# Patient Record
Sex: Male | Born: 1965 | Race: White | Hispanic: No | Marital: Married | Smoking: Never smoker
Health system: Southern US, Community
[De-identification: ages and names within clinical notes are randomized; demographics above are authoritative.]

## PROBLEM LIST (undated history)

## (undated) DIAGNOSIS — I10 Essential (primary) hypertension: Secondary | ICD-10-CM

## (undated) DIAGNOSIS — G4730 Sleep apnea, unspecified: Secondary | ICD-10-CM

## (undated) DIAGNOSIS — M1990 Unspecified osteoarthritis, unspecified site: Secondary | ICD-10-CM

## (undated) DIAGNOSIS — S46212A Strain of muscle, fascia and tendon of other parts of biceps, left arm, initial encounter: Secondary | ICD-10-CM

## (undated) DIAGNOSIS — R195 Other fecal abnormalities: Secondary | ICD-10-CM

## (undated) HISTORY — PX: CHOLECYSTECTOMY: SHX55

## (undated) HISTORY — PX: APPENDECTOMY: SHX54

## (undated) HISTORY — DX: Essential (primary) hypertension: I10

## (undated) HISTORY — DX: Other fecal abnormalities: R19.5

---

## 2002-12-20 ENCOUNTER — Emergency Department (HOSPITAL_COMMUNITY): Admission: EM | Admit: 2002-12-20 | Discharge: 2002-12-21 | Payer: Self-pay | Admitting: *Deleted

## 2015-12-20 DIAGNOSIS — M19072 Primary osteoarthritis, left ankle and foot: Secondary | ICD-10-CM | POA: Diagnosis not present

## 2016-03-03 DIAGNOSIS — Z6841 Body Mass Index (BMI) 40.0 and over, adult: Secondary | ICD-10-CM | POA: Diagnosis not present

## 2016-03-03 DIAGNOSIS — Z79899 Other long term (current) drug therapy: Secondary | ICD-10-CM | POA: Diagnosis not present

## 2016-03-03 DIAGNOSIS — K3580 Unspecified acute appendicitis: Secondary | ICD-10-CM | POA: Diagnosis not present

## 2016-03-03 DIAGNOSIS — R1013 Epigastric pain: Secondary | ICD-10-CM | POA: Diagnosis not present

## 2016-03-03 DIAGNOSIS — Z888 Allergy status to other drugs, medicaments and biological substances status: Secondary | ICD-10-CM | POA: Diagnosis not present

## 2016-03-03 DIAGNOSIS — R109 Unspecified abdominal pain: Secondary | ICD-10-CM | POA: Diagnosis not present

## 2016-03-03 DIAGNOSIS — I10 Essential (primary) hypertension: Secondary | ICD-10-CM | POA: Diagnosis not present

## 2016-03-03 DIAGNOSIS — K3589 Other acute appendicitis: Secondary | ICD-10-CM | POA: Diagnosis not present

## 2016-03-03 DIAGNOSIS — K37 Unspecified appendicitis: Secondary | ICD-10-CM | POA: Diagnosis not present

## 2016-03-03 DIAGNOSIS — K353 Acute appendicitis with localized peritonitis: Secondary | ICD-10-CM | POA: Diagnosis not present

## 2016-03-03 DIAGNOSIS — R739 Hyperglycemia, unspecified: Secondary | ICD-10-CM | POA: Diagnosis not present

## 2016-03-03 DIAGNOSIS — E876 Hypokalemia: Secondary | ICD-10-CM | POA: Diagnosis not present

## 2016-03-04 DIAGNOSIS — K353 Acute appendicitis with localized peritonitis: Secondary | ICD-10-CM | POA: Diagnosis not present

## 2016-03-14 DIAGNOSIS — K8020 Calculus of gallbladder without cholecystitis without obstruction: Secondary | ICD-10-CM | POA: Diagnosis not present

## 2016-03-14 DIAGNOSIS — Z6841 Body Mass Index (BMI) 40.0 and over, adult: Secondary | ICD-10-CM | POA: Diagnosis not present

## 2016-03-14 DIAGNOSIS — I10 Essential (primary) hypertension: Secondary | ICD-10-CM | POA: Diagnosis not present

## 2016-03-14 DIAGNOSIS — E876 Hypokalemia: Secondary | ICD-10-CM | POA: Diagnosis not present

## 2016-05-19 DIAGNOSIS — Z79899 Other long term (current) drug therapy: Secondary | ICD-10-CM | POA: Diagnosis not present

## 2016-05-19 DIAGNOSIS — K8050 Calculus of bile duct without cholangitis or cholecystitis without obstruction: Secondary | ICD-10-CM | POA: Diagnosis not present

## 2016-05-19 DIAGNOSIS — K8070 Calculus of gallbladder and bile duct without cholecystitis without obstruction: Secondary | ICD-10-CM | POA: Diagnosis not present

## 2016-05-19 DIAGNOSIS — R112 Nausea with vomiting, unspecified: Secondary | ICD-10-CM | POA: Diagnosis not present

## 2016-05-19 DIAGNOSIS — K8020 Calculus of gallbladder without cholecystitis without obstruction: Secondary | ICD-10-CM | POA: Diagnosis not present

## 2016-05-19 DIAGNOSIS — R1011 Right upper quadrant pain: Secondary | ICD-10-CM | POA: Diagnosis not present

## 2016-05-20 DIAGNOSIS — Z6841 Body Mass Index (BMI) 40.0 and over, adult: Secondary | ICD-10-CM | POA: Diagnosis not present

## 2016-05-20 DIAGNOSIS — K8020 Calculus of gallbladder without cholecystitis without obstruction: Secondary | ICD-10-CM | POA: Diagnosis not present

## 2016-05-22 DIAGNOSIS — K8012 Calculus of gallbladder with acute and chronic cholecystitis without obstruction: Secondary | ICD-10-CM | POA: Diagnosis not present

## 2016-05-23 DIAGNOSIS — K8012 Calculus of gallbladder with acute and chronic cholecystitis without obstruction: Secondary | ICD-10-CM | POA: Diagnosis not present

## 2016-05-23 DIAGNOSIS — I10 Essential (primary) hypertension: Secondary | ICD-10-CM | POA: Diagnosis not present

## 2016-05-23 DIAGNOSIS — Z8249 Family history of ischemic heart disease and other diseases of the circulatory system: Secondary | ICD-10-CM | POA: Diagnosis not present

## 2016-05-23 DIAGNOSIS — Z79899 Other long term (current) drug therapy: Secondary | ICD-10-CM | POA: Diagnosis not present

## 2016-05-23 DIAGNOSIS — Z6841 Body Mass Index (BMI) 40.0 and over, adult: Secondary | ICD-10-CM | POA: Diagnosis not present

## 2016-05-26 DIAGNOSIS — K8012 Calculus of gallbladder with acute and chronic cholecystitis without obstruction: Secondary | ICD-10-CM | POA: Diagnosis not present

## 2016-05-26 DIAGNOSIS — Z6841 Body Mass Index (BMI) 40.0 and over, adult: Secondary | ICD-10-CM | POA: Diagnosis not present

## 2016-05-26 DIAGNOSIS — K8050 Calculus of bile duct without cholangitis or cholecystitis without obstruction: Secondary | ICD-10-CM | POA: Diagnosis not present

## 2016-05-26 DIAGNOSIS — Z79899 Other long term (current) drug therapy: Secondary | ICD-10-CM | POA: Diagnosis not present

## 2016-05-26 DIAGNOSIS — M069 Rheumatoid arthritis, unspecified: Secondary | ICD-10-CM | POA: Diagnosis not present

## 2016-05-26 DIAGNOSIS — I10 Essential (primary) hypertension: Secondary | ICD-10-CM | POA: Diagnosis not present

## 2016-05-26 DIAGNOSIS — K8010 Calculus of gallbladder with chronic cholecystitis without obstruction: Secondary | ICD-10-CM | POA: Diagnosis not present

## 2016-05-26 DIAGNOSIS — K8000 Calculus of gallbladder with acute cholecystitis without obstruction: Secondary | ICD-10-CM | POA: Diagnosis not present

## 2016-05-26 DIAGNOSIS — Z8249 Family history of ischemic heart disease and other diseases of the circulatory system: Secondary | ICD-10-CM | POA: Diagnosis not present

## 2016-05-27 DIAGNOSIS — Z6841 Body Mass Index (BMI) 40.0 and over, adult: Secondary | ICD-10-CM | POA: Diagnosis not present

## 2016-05-27 DIAGNOSIS — Z79899 Other long term (current) drug therapy: Secondary | ICD-10-CM | POA: Diagnosis not present

## 2016-05-27 DIAGNOSIS — Z8249 Family history of ischemic heart disease and other diseases of the circulatory system: Secondary | ICD-10-CM | POA: Diagnosis not present

## 2016-05-27 DIAGNOSIS — K8012 Calculus of gallbladder with acute and chronic cholecystitis without obstruction: Secondary | ICD-10-CM | POA: Diagnosis not present

## 2016-05-27 DIAGNOSIS — I10 Essential (primary) hypertension: Secondary | ICD-10-CM | POA: Diagnosis not present

## 2016-11-13 DIAGNOSIS — I10 Essential (primary) hypertension: Secondary | ICD-10-CM | POA: Diagnosis not present

## 2016-11-13 DIAGNOSIS — E782 Mixed hyperlipidemia: Secondary | ICD-10-CM | POA: Diagnosis not present

## 2016-11-13 DIAGNOSIS — E876 Hypokalemia: Secondary | ICD-10-CM | POA: Diagnosis not present

## 2016-11-20 DIAGNOSIS — Z0001 Encounter for general adult medical examination with abnormal findings: Secondary | ICD-10-CM | POA: Diagnosis not present

## 2016-11-20 DIAGNOSIS — Z6841 Body Mass Index (BMI) 40.0 and over, adult: Secondary | ICD-10-CM | POA: Diagnosis not present

## 2017-05-18 DIAGNOSIS — R7301 Impaired fasting glucose: Secondary | ICD-10-CM | POA: Diagnosis not present

## 2017-05-18 DIAGNOSIS — E782 Mixed hyperlipidemia: Secondary | ICD-10-CM | POA: Diagnosis not present

## 2017-05-18 DIAGNOSIS — E876 Hypokalemia: Secondary | ICD-10-CM | POA: Diagnosis not present

## 2017-05-18 DIAGNOSIS — E291 Testicular hypofunction: Secondary | ICD-10-CM | POA: Diagnosis not present

## 2017-05-18 DIAGNOSIS — I10 Essential (primary) hypertension: Secondary | ICD-10-CM | POA: Diagnosis not present

## 2017-05-21 DIAGNOSIS — I10 Essential (primary) hypertension: Secondary | ICD-10-CM | POA: Diagnosis not present

## 2017-05-21 DIAGNOSIS — E876 Hypokalemia: Secondary | ICD-10-CM | POA: Diagnosis not present

## 2017-05-21 DIAGNOSIS — K8020 Calculus of gallbladder without cholecystitis without obstruction: Secondary | ICD-10-CM | POA: Diagnosis not present

## 2017-07-23 DIAGNOSIS — Z1211 Encounter for screening for malignant neoplasm of colon: Secondary | ICD-10-CM | POA: Diagnosis not present

## 2017-07-23 DIAGNOSIS — Z1212 Encounter for screening for malignant neoplasm of rectum: Secondary | ICD-10-CM | POA: Diagnosis not present

## 2017-08-05 LAB — IFOBT (OCCULT BLOOD): IFOBT: POSITIVE

## 2017-08-11 DIAGNOSIS — J09X9 Influenza due to identified novel influenza A virus with other manifestations: Secondary | ICD-10-CM | POA: Diagnosis not present

## 2017-08-11 DIAGNOSIS — Z6841 Body Mass Index (BMI) 40.0 and over, adult: Secondary | ICD-10-CM | POA: Diagnosis not present

## 2017-09-10 ENCOUNTER — Encounter (INDEPENDENT_AMBULATORY_CARE_PROVIDER_SITE_OTHER): Payer: Self-pay | Admitting: Internal Medicine

## 2017-09-10 ENCOUNTER — Telehealth (INDEPENDENT_AMBULATORY_CARE_PROVIDER_SITE_OTHER): Payer: Self-pay | Admitting: *Deleted

## 2017-09-10 ENCOUNTER — Encounter (INDEPENDENT_AMBULATORY_CARE_PROVIDER_SITE_OTHER): Payer: Self-pay | Admitting: *Deleted

## 2017-09-10 ENCOUNTER — Ambulatory Visit (INDEPENDENT_AMBULATORY_CARE_PROVIDER_SITE_OTHER): Payer: BLUE CROSS/BLUE SHIELD | Admitting: Internal Medicine

## 2017-09-10 VITALS — BP 158/98 | HR 72 | Temp 98.7°F | Ht 71.5 in | Wt 305.2 lb

## 2017-09-10 DIAGNOSIS — R195 Other fecal abnormalities: Secondary | ICD-10-CM

## 2017-09-10 DIAGNOSIS — I10 Essential (primary) hypertension: Secondary | ICD-10-CM | POA: Insufficient documentation

## 2017-09-10 HISTORY — DX: Other fecal abnormalities: R19.5

## 2017-09-10 LAB — CBC
HCT: 43.2 % (ref 38.5–50.0)
HEMOGLOBIN: 15.1 g/dL (ref 13.2–17.1)
MCH: 28.7 pg (ref 27.0–33.0)
MCHC: 35 g/dL (ref 32.0–36.0)
MCV: 82 fL (ref 80.0–100.0)
MPV: 9.6 fL (ref 7.5–12.5)
Platelets: 343 10*3/uL (ref 140–400)
RBC: 5.27 10*6/uL (ref 4.20–5.80)
RDW: 14.3 % (ref 11.0–15.0)
WBC: 9.2 10*3/uL (ref 3.8–10.8)

## 2017-09-10 MED ORDER — PEG 3350-KCL-NA BICARB-NACL 420 G PO SOLR: 4000.0000 mL | Freq: Once | ORAL | 0 refills | Status: AC

## 2017-09-10 NOTE — Progress Notes (Signed)
   Subjective:    Patient ID: Carlos Rogers, male    DOB: 1966/03/19, 52 y.o.   MRN: [id number]  HPI Referred by Dr. Quillian Quince for positive hemosure. He states he has not seen any blood in his stools. Since he has had GB, he usually has a BM after each meal. Stools are loose about 50% of the time. His appetite is okay. No weight loss that he know of.  Patient has never undergone a colonoscopy.  Takes Advil 4 tabs once or twice a day for pain his left foot.   No family hx of colon cancer per wife.     05/19/2017 total bili 0.6, ALP 79, AST 23, ALT 20./   Review of Systems     Past Medical History:  Diagnosis Date  . Hypertension   . Occult blood in stools       No Known Allergies  Current Outpatient Medications on File Prior to Visit  Medication Sig Dispense Refill  . amLODipine (NORVASC) 10 MG tablet Take 10 mg by mouth daily.    . chlorthalidone (HYGROTON) 25 MG tablet Take 25 mg by mouth daily.    . sildenafil (REVATIO) 20 MG tablet Take 20 mg by mouth as needed.     No current facility-administered medications on file prior to visit.      Objective:   Physical Exam Blood pressure (!) 158/98, pulse 72, temperature 98.7 F (37.1 C), height 5' 11.5" (1.816 m), weight (!) 305 lb 3.2 oz (138.4 kg). Alert and oriented. Skin warm and dry. Oral mucosa is moist.   . Sclera anicteric, conjunctivae is pink. Thyroid not enlarged. No cervical lymphadenopathy. Lungs clear. Heart regular rate and rhythm.  Abdomen is soft. Bowel sounds are positive. No hepatomegaly. No abdominal masses felt. No tenderness.  No edema to lower extremities.         Assessment & Plan:  Positive hemosure. Needs colonoscopy to rule out colonic neoplasm, AVM, polyp, ulcer, hemorrhoids. CBC today

## 2017-09-10 NOTE — Telephone Encounter (Signed)
Patient needs trilyte 

## 2017-09-10 NOTE — Patient Instructions (Signed)
The risks of bleeding, perforation and infection were reviewed with patient.  

## 2017-09-14 DIAGNOSIS — M4696 Unspecified inflammatory spondylopathy, lumbar region: Secondary | ICD-10-CM | POA: Diagnosis not present

## 2017-09-14 DIAGNOSIS — E876 Hypokalemia: Secondary | ICD-10-CM | POA: Diagnosis not present

## 2017-09-14 DIAGNOSIS — M48061 Spinal stenosis, lumbar region without neurogenic claudication: Secondary | ICD-10-CM | POA: Diagnosis not present

## 2017-09-14 DIAGNOSIS — I10 Essential (primary) hypertension: Secondary | ICD-10-CM | POA: Diagnosis not present

## 2017-09-14 DIAGNOSIS — Z79899 Other long term (current) drug therapy: Secondary | ICD-10-CM | POA: Diagnosis not present

## 2017-09-14 DIAGNOSIS — M545 Low back pain: Secondary | ICD-10-CM | POA: Diagnosis not present

## 2017-09-14 DIAGNOSIS — M47816 Spondylosis without myelopathy or radiculopathy, lumbar region: Secondary | ICD-10-CM | POA: Diagnosis not present

## 2017-10-08 ENCOUNTER — Other Ambulatory Visit: Payer: Self-pay

## 2017-10-08 ENCOUNTER — Ambulatory Visit (HOSPITAL_COMMUNITY)
Admission: RE | Admit: 2017-10-08 | Discharge: 2017-10-08 | Disposition: A | Discharge status: Home / Self Care | Payer: BLUE CROSS/BLUE SHIELD | Source: Ambulatory Visit | Attending: Internal Medicine | Admitting: Internal Medicine

## 2017-10-08 ENCOUNTER — Encounter (HOSPITAL_COMMUNITY): Payer: Self-pay | Admitting: *Deleted

## 2017-10-08 ENCOUNTER — Encounter (HOSPITAL_COMMUNITY)
Admission: RE | Disposition: A | Discharge status: Home / Self Care | Payer: Self-pay | Source: Ambulatory Visit | Attending: Internal Medicine

## 2017-10-08 DIAGNOSIS — D123 Benign neoplasm of transverse colon: Secondary | ICD-10-CM | POA: Insufficient documentation

## 2017-10-08 DIAGNOSIS — R195 Other fecal abnormalities: Secondary | ICD-10-CM | POA: Diagnosis not present

## 2017-10-08 DIAGNOSIS — I10 Essential (primary) hypertension: Secondary | ICD-10-CM | POA: Diagnosis not present

## 2017-10-08 DIAGNOSIS — K648 Other hemorrhoids: Secondary | ICD-10-CM | POA: Diagnosis not present

## 2017-10-08 DIAGNOSIS — K5730 Diverticulosis of large intestine without perforation or abscess without bleeding: Secondary | ICD-10-CM | POA: Diagnosis not present

## 2017-10-08 DIAGNOSIS — K635 Polyp of colon: Secondary | ICD-10-CM | POA: Insufficient documentation

## 2017-10-08 DIAGNOSIS — K644 Residual hemorrhoidal skin tags: Secondary | ICD-10-CM | POA: Insufficient documentation

## 2017-10-08 DIAGNOSIS — E669 Obesity, unspecified: Secondary | ICD-10-CM | POA: Diagnosis not present

## 2017-10-08 HISTORY — PX: COLONOSCOPY: SHX5424

## 2017-10-08 SURGERY — COLONOSCOPY
Anesthesia: Moderate Sedation

## 2017-10-08 MED ORDER — MEPERIDINE HCL 50 MG/ML IJ SOLN
INTRAMUSCULAR | Status: AC
  Filled 2017-10-08: qty 1

## 2017-10-08 MED ORDER — MIDAZOLAM HCL 5 MG/5ML IJ SOLN
INTRAMUSCULAR | Status: AC
  Filled 2017-10-08: qty 10

## 2017-10-08 MED ORDER — MEPERIDINE HCL 50 MG/ML IJ SOLN
INTRAMUSCULAR | Status: DC | PRN
  Administered 2017-10-08 (×2): 25 mg via INTRAVENOUS

## 2017-10-08 MED ORDER — SIMETHICONE 40 MG/0.6ML PO SUSP
ORAL | Status: DC | PRN
  Administered 2017-10-08: 10:00:00

## 2017-10-08 MED ORDER — SODIUM CHLORIDE 0.9 % IV SOLN
INTRAVENOUS | Status: DC
  Administered 2017-10-08: 10:00:00 via INTRAVENOUS

## 2017-10-08 MED ORDER — MIDAZOLAM HCL 5 MG/5ML IJ SOLN
INTRAMUSCULAR | Status: DC | PRN
  Administered 2017-10-08: 2 mg via INTRAVENOUS
  Administered 2017-10-08: 1 mg via INTRAVENOUS
  Administered 2017-10-08: 3 mg via INTRAVENOUS
  Administered 2017-10-08: 1 mg via INTRAVENOUS
  Administered 2017-10-08: 2 mg via INTRAVENOUS
  Administered 2017-10-08: 1 mg via INTRAVENOUS

## 2017-10-08 NOTE — Op Note (Addendum)
[facility] Patient Name: Carlos Rogers Procedure Date: 10/08/2017 10:01 AM MRN: [id number] Date of Birth: March 12, 1966 Attending MD: Hildred Laser , MD CSN: [id number] Age: 52 Admit Type: Outpatient Procedure:                Colonoscopy Indications:              Heme positive stool Providers:                Hildred Laser, MD, Otis Peak B. Sharon Seller, RN, Nelma Rothman, Technician Referring MD:             Gar Ponto, MD Medicines:                Meperidine 50 mg IV, Midazolam 10 mg IV Complications:            No immediate complications. Estimated Blood Loss:     Estimated blood loss: none. Procedure:                Pre-Anesthesia Assessment:                           - Prior to the procedure, a History and Physical                            was performed, and patient medications and                            allergies were reviewed. The patient's tolerance of                            previous anesthesia was also reviewed. The risks                            and benefits of the procedure and the sedation                            options and risks were discussed with the patient.                            All questions were answered, and informed consent                            was obtained. Prior Anticoagulants: The patient                            last took ibuprofen 5 days prior to the procedure.                            ASA Grade Assessment: II - A patient with mild                            systemic disease. After reviewing the risks and  benefits, the patient was deemed in satisfactory                            condition to undergo the procedure.                           After obtaining informed consent, the colonoscope                            was passed under direct vision. Throughout the                            procedure, the patient's blood pressure, pulse, and                            oxygen  saturations were monitored continuously. The                            EC-[id number] ([id number]) scope was introduced through                            the anus and advanced to the the cecum, identified                            by appendiceal orifice and ileocecal valve. The                            colonoscopy was performed without difficulty. The                            patient tolerated the procedure well. The quality                            of the bowel preparation was excellent. Scope In: 10:21:11 AM Scope Out: 10:53:06 AM Scope Withdrawal Time: 0 hours 28 minutes 25 seconds  Total Procedure Duration: 0 hours 31 minutes 55 seconds  Findings:      The perianal and digital rectal examinations were normal.      A 6 mm polyp was found in the mid transverse colon. The polyp was       semi-sessile. The polyp was removed with a hot snare. Resection and       retrieval were complete. The pathology specimen was placed into Bottle       Number 1.      A 6 mm polyp was found in the splenic flexure. The polyp was       semi-sessile. The polyp was removed with a hot snare. Resection was       complete, but the polyp tissue was not retrieved.      The exam was otherwise normal throughout the examined colon.      External and internal hemorrhoids were found during retroflexion.      A single small-mouthed diverticulum was found in the cecum. Impression:               - One 6 mm polyp in the mid transverse colon,  removed with a hot snare. Resected and retrieved.                           - One 6 mm polyp at the splenic flexure, removed                            with a hot snare. Complete resection. Polyp tissue                            not retrieved.                           - Single small cecal diverticulum.                           - External and internal hemorrhoids. Moderate Sedation:      Moderate (conscious) sedation was administered by the endoscopy  nurse       and supervised by the endoscopist. The following parameters were       monitored: oxygen saturation, heart rate, blood pressure, CO2       capnography and response to care. Total physician intraservice time was       39 minutes. Recommendation:           - Patient has a contact number available for                            emergencies. The signs and symptoms of potential                            delayed complications were discussed with the                            patient. Return to normal activities tomorrow.                            Written discharge instructions were provided to the                            patient.                           - Resume previous diet today.                           - Continue present medications.                           - No aspirin, ibuprofen, naproxen, or other                            non-steroidal anti-inflammatory drugs for 5 days                            after polyp removal.                           -  Await pathology results.                           - Repeat colonoscopy in 5 years for surveillance. Procedure Code(s):        --- Professional ---                           [phone], Colonoscopy, flexible; with removal of                            tumor(s), polyp(s), or other lesion(s) by snare                            technique                           G0500, Moderate sedation services provided by the                            same physician or other qualified health care                            professional performing a gastrointestinal                            endoscopic service that sedation supports,                            requiring the presence of an independent trained                            observer to assist in the monitoring of the                            patient's level of consciousness and physiological                            status; initial 15 minutes of intra-service time;                             patient age 27 years or older (additional time may                            be reported with [phone], as appropriate)                           [phone], Moderate sedation services provided by the                            same physician or other qualified health care                            professional performing the diagnostic or  therapeutic service that the sedation supports,                            requiring the presence of an independent trained                            observer to assist in the monitoring of the                            patient's level of consciousness and physiological                            status; each additional 15 minutes intraservice                            time (List separately in addition to code for                            primary service)                           [phone], Moderate sedation services provided by the                            same physician or other qualified health care                            professional performing the diagnostic or                            therapeutic service that the sedation supports,                            requiring the presence of an independent trained                            observer to assist in the monitoring of the                            patient's level of consciousness and physiological                            status; each additional 15 minutes intraservice                            time (List separately in addition to code for                            primary service) Diagnosis Code(s):        --- Professional ---                           D12.3, Benign neoplasm of transverse colon (hepatic  flexure or splenic flexure)                           K64.8, Other hemorrhoids                           R19.5, Other fecal abnormalities CPT copyright 2017 American Medical Association. All rights reserved. The  codes documented in this report are preliminary and upon coder review may  be revised to meet current compliance requirements. Hildred Laser, MD Hildred Laser, MD 10/08/2017 11:03:23 AM This report has been signed electronically. Number of Addenda: 0

## 2017-10-08 NOTE — H&P (Signed)
Carlos Rogers is an 52 y.o. male.   Chief Complaint: Patient is here for colonoscopy. HPI: Patient is a 52 year old Caucasian male who was found to have heme positive stool on routine testing(hemasure) and is therefore here for diagnostic colonoscopy.  He denies abdominal pain change in bowel habits melena or rectal bleeding.  He takes Advil on as-needed basis.  He may take Advil once or twice a week.  Usual doses 800 mg a day. Family history is negative for CRC.  Past Medical History:  Diagnosis Date  . Obesity 09/10/2017  . Hypertension         Past Surgical History:  Procedure Laterality Date  . APPENDECTOMY    . CHOLECYSTECTOMY      Family History  Problem Relation Age of Onset  . Heart disease Mother   . Hypertension Mother    Social History:  reports that he has never smoked. He has never used smokeless tobacco. He reports that he drank alcohol. He reports that he does not use drugs.  Allergies: No Known Allergies  Medications Prior to Admission  Medication Sig Dispense Refill  . amLODipine (NORVASC) 10 MG tablet Take 10 mg by mouth daily.    . chlorthalidone (HYGROTON) 25 MG tablet Take 25 mg by mouth daily.    Marland Kitchen ibuprofen (ADVIL,MOTRIN) 200 MG tablet Take 600-800 mg by mouth every 8 (eight) hours as needed (for pain.).    Marland Kitchen sildenafil (REVATIO) 20 MG tablet Take 20 mg by mouth daily as needed (for erectile dysfunction).       No results found for this or any previous visit (from the past 48 hour(s)). No results found.  ROS  Blood pressure 128/78, pulse 72, temperature 98.9 F (37.2 C), temperature source Oral, resp. rate 15, SpO2 96 %. Physical Exam  Constitutional:  Well-developed obese Caucasian male in NAD.  HENT:  Mouth/Throat: Oropharynx is clear and moist.  Eyes: Conjunctivae are normal. No scleral icterus.  Neck: No thyromegaly present.  Cardiovascular: Normal rate, regular rhythm and normal heart sounds.  No murmur heard. Respiratory: Effort normal  and breath sounds normal.  GI:  Abdomen is full.  Supraumbilical scar noted along with laparoscopy scars in upper abdomen.  Abdomen is soft and nontender with organomegaly or masses.  Small subcutaneous lipoma noted along the left lateral wall below the costal margin.  Musculoskeletal: He exhibits no edema.  Lymphadenopathy:    He has no cervical adenopathy.  Neurological: He is alert.  Skin: Skin is warm and dry.     Assessment/Plan Heme positive stool. Diagnostic colonoscopy.  Hildred Laser, MD 10/08/2017, 10:09 AM

## 2017-10-08 NOTE — Discharge Instructions (Signed)
Colonoscopy, Adult, Care After This sheet gives you information about how to care for yourself after your procedure. Your doctor may also give you more specific instructions. If you have problems or questions, call your doctor. Follow these instructions at home: General instructions   For the first 24 hours after the procedure: ? Do not drive or use machinery. ? Do not sign important documents. ? Do not drink alcohol. ? Do your daily activities more slowly than normal. ? Eat foods that are soft and easy to digest. ? Rest often.  Take over-the-counter or prescription medicines only as told by your doctor.  It is up to you to get the results of your procedure. Ask your doctor, or the department performing the procedure, when your results will be ready. To help cramping and bloating:  Try walking around.  Put heat on your belly (abdomen) as told by your doctor. Use a heat source that your doctor recommends, such as a moist heat pack or a heating pad. ? Put a towel between your skin and the heat source. ? Leave the heat on for 20-30 minutes. ? Remove the heat if your skin turns bright red. This is especially important if you cannot feel pain, heat, or cold. You can get burned. Eating and drinking  Drink enough fluid to keep your pee (urine) clear or pale yellow.  Return to your normal diet as told by your doctor. Avoid heavy or fried foods that are hard to digest.  Avoid drinking alcohol for as long as told by your doctor. Contact a doctor if:  You have blood in your poop (stool) 2-3 days after the procedure. Get help right away if:  You have more than a small amount of blood in your poop.  You see large clumps of tissue (blood clots) in your poop.  Your belly is swollen.  You feel sick to your stomach (nauseous).  You throw up (vomit).  You have a fever.  You have belly pain that gets worse, and medicine does not help your pain. This information is not intended to  replace advice given to you by your health care provider. Make sure you discuss any questions you have with your health care provider.   No aspirin or NSAIDs for 5 days. Resume other medications and diet as before. No driving for 24 hours. Physician will call with biopsy results.   Colon Polyps Polyps are tissue growths inside the body. Polyps can grow in many places, including the large intestine (colon). A polyp may be a round bump or a mushroom-shaped growth. You could have one polyp or several. Most colon polyps are noncancerous (benign). However, some colon polyps can become cancerous over time. What are the causes? The exact cause of colon polyps is not known. What increases the risk? This condition is more likely to develop in people who:  Have a family history of colon cancer or colon polyps.  Are older than 87 or older than 45 if they are African American.  Have inflammatory bowel disease, such as ulcerative colitis or Crohn disease.  Are overweight.  Smoke cigarettes.  Do not get enough exercise.  Drink too much alcohol.  Eat a diet that is: ? High in fat and red meat. ? Low in fiber.  Had childhood cancer that was treated with abdominal radiation.  What are the signs or symptoms? Most polyps do not cause symptoms. If you have symptoms, they may include:  Blood coming from your rectum when having a  bowel movement.  Blood in your stool.The stool may look dark red or black.  A change in bowel habits, such as constipation or diarrhea.  How is this diagnosed? This condition is diagnosed with a colonoscopy. This is a procedure that uses a lighted, flexible scope to look at the inside of your colon. How is this treated? Treatment for this condition involves removing any polyps that are found. Those polyps will then be tested for cancer. If cancer is found, your health care provider will talk to you about options for colon cancer treatment. Follow these  instructions at home: Diet  Eat plenty of fiber, such as fruits, vegetables, and whole grains.  Eat foods that are high in calcium and vitamin D, such as milk, cheese, yogurt, eggs, liver, fish, and broccoli.  Limit foods high in fat, red meats, and processed meats, such as hot dogs, sausage, bacon, and lunch meats.  Maintain a healthy weight, or lose weight if recommended by your health care provider. General instructions  Do not smoke cigarettes.  Do not drink alcohol excessively.  Keep all follow-up visits as told by your health care provider. This is important. This includes keeping regularly scheduled colonoscopies. Talk to your health care provider about when you need a colonoscopy.  Exercise every day or as told by your health care provider. Contact a health care provider if:  You have new or worsening bleeding during a bowel movement.  You have new or increased blood in your stool.  You have a change in bowel habits.  You unexpectedly lose weight. This information is not intended to replace advice given to you by your health care provider. Make sure you discuss any questions you have with your health care provider. Document Released: 03/05/2004 Document Revised: 11/15/2015 Document Reviewed: 04/30/2015 Elsevier Interactive Patient Education  2018 [employer].    Hemorrhoids Hemorrhoids are swollen veins in and around the rectum or anus. Hemorrhoids can cause pain, itching, or bleeding. Most of the time, they do not cause serious problems. They usually get better with diet changes, lifestyle changes, and other home treatments. Follow these instructions at home: Eating and drinking  Eat foods that have fiber, such as whole grains, beans, nuts, fruits, and vegetables. Ask your doctor about taking products that have added fiber (fibersupplements).  Drink enough fluid to keep your pee (urine) clear or pale yellow. For Pain and Swelling  Take a warm-water bath (sitz  bath) for 20 minutes to ease pain. Do this 3-4 times a day.  If directed, put ice on the painful area. It may be helpful to use ice between your warm baths. ? Put ice in a plastic bag. ? Place a towel between your skin and the bag. ? Leave the ice on for 20 minutes, 2-3 times a day. General instructions  Take over-the-counter and prescription medicines only as told by your doctor. ? Medicated creams and medicines that are inserted into the anus (suppositories) may be used or applied as told.  Exercise often.  Go to the bathroom when you have the urge to poop (to have a bowel movement). Do not wait.  Avoid pushing too hard (straining) when you poop.  Keep the butt area dry and clean. Use wet toilet paper or moist paper towels.  Do not sit on the toilet for a long time. Contact a doctor if:  You have any of these: ? Pain and swelling that do not get better with treatment or medicine. ? Bleeding that will not  stop. ? Trouble pooping or you cannot poop. ? Pain or swelling outside the area of the hemorrhoids. This information is not intended to replace advice given to you by your health care provider. Make sure you discuss any questions you have with your health care provider.    Diverticulosis Diverticulosis is a condition that develops when small pouches (diverticula) form in the wall of the large intestine (colon). The colon is where water is absorbed and stool is formed. The pouches form when the inside layer of the colon pushes through weak spots in the outer layers of the colon. You may have a few pouches or many of them. What are the causes? The cause of this condition is not known. What increases the risk? The following factors may make you more likely to develop this condition:  Being older than age 38. Your risk for this condition increases with age. Diverticulosis is rare among people younger than age 62. By age 22, many people have it.  Eating a low-fiber  diet.  Having frequent constipation.  Being overweight.  Not getting enough exercise.  Smoking.  Taking over-the-counter pain medicines, like aspirin and ibuprofen.  Having a family history of diverticulosis.  What are the signs or symptoms? In most people, there are no symptoms of this condition. If you do have symptoms, they may include:  Bloating.  Cramps in the abdomen.  Constipation or diarrhea.  Pain in the lower left side of the abdomen.  How is this diagnosed? This condition is most often diagnosed during an exam for other colon problems. Because diverticulosis usually has no symptoms, it often cannot be diagnosed independently. This condition may be diagnosed by:  Using a flexible scope to examine the colon (colonoscopy).  Taking an X-ray of the colon after dye has been put into the colon (barium enema).  Doing a CT scan.  How is this treated? You may not need treatment for this condition if you have never developed an infection related to diverticulosis. If you have had an infection before, treatment may include:  Eating a high-fiber diet. This may include eating more fruits, vegetables, and grains.  Taking a fiber supplement.  Taking a live bacteria supplement (probiotic).  Taking medicine to relax your colon.  Taking antibiotic medicines.  Follow these instructions at home:  Drink 6-8 glasses of water or more each day to prevent constipation.  Try not to strain when you have a bowel movement.  If you have had an infection before: ? Eat more fiber as directed by your health care provider or your diet and nutrition specialist (dietitian). ? Take a fiber supplement or probiotic, if your health care provider approves.  Take over-the-counter and prescription medicines only as told by your health care provider.  If you were prescribed an antibiotic, take it as told by your health care provider. Do not stop taking the antibiotic even if you start to  feel better.  Keep all follow-up visits as told by your health care provider. This is important. Contact a health care provider if:  You have pain in your abdomen.  You have bloating.  You have cramps.  You have not had a bowel movement in 3 days. Get help right away if:  Your pain gets worse.  Your bloating becomes very bad.  You have a fever or chills, and your symptoms suddenly get worse.  You vomit.  You have bowel movements that are bloody or black.  You have bleeding from your rectum. Summary  Diverticulosis is a condition that develops when small pouches (diverticula) form in the wall of the large intestine (colon).  You may have a few pouches or many of them.  This condition is most often diagnosed during an exam for other colon problems.  If you have had an infection related to diverticulosis, treatment may include increasing the fiber in your diet, taking supplements, or taking medicines. This information is not intended to replace advice given to you by your health care provider. Make sure you discuss any questions you have with your health care provider. Marland Kitchen

## 2017-10-14 ENCOUNTER — Encounter (HOSPITAL_COMMUNITY): Payer: Self-pay | Admitting: Internal Medicine

## 2017-12-18 DIAGNOSIS — I10 Essential (primary) hypertension: Secondary | ICD-10-CM | POA: Diagnosis not present

## 2017-12-18 DIAGNOSIS — E782 Mixed hyperlipidemia: Secondary | ICD-10-CM | POA: Diagnosis not present

## 2017-12-18 DIAGNOSIS — R7301 Impaired fasting glucose: Secondary | ICD-10-CM | POA: Diagnosis not present

## 2017-12-18 DIAGNOSIS — E876 Hypokalemia: Secondary | ICD-10-CM | POA: Diagnosis not present

## 2017-12-21 DIAGNOSIS — Z0001 Encounter for general adult medical examination with abnormal findings: Secondary | ICD-10-CM | POA: Diagnosis not present

## 2017-12-21 DIAGNOSIS — E876 Hypokalemia: Secondary | ICD-10-CM | POA: Diagnosis not present

## 2017-12-21 DIAGNOSIS — I10 Essential (primary) hypertension: Secondary | ICD-10-CM | POA: Diagnosis not present

## 2018-02-04 ENCOUNTER — Ambulatory Visit (INDEPENDENT_AMBULATORY_CARE_PROVIDER_SITE_OTHER): Payer: Self-pay

## 2018-02-04 ENCOUNTER — Ambulatory Visit (INDEPENDENT_AMBULATORY_CARE_PROVIDER_SITE_OTHER): Payer: BLUE CROSS/BLUE SHIELD | Admitting: Orthopaedic Surgery

## 2018-02-04 ENCOUNTER — Encounter (INDEPENDENT_AMBULATORY_CARE_PROVIDER_SITE_OTHER): Payer: Self-pay | Admitting: Orthopaedic Surgery

## 2018-02-04 VITALS — BP 121/73 | HR 66 | Ht 71.0 in | Wt 310.0 lb

## 2018-02-04 DIAGNOSIS — M25572 Pain in left ankle and joints of left foot: Secondary | ICD-10-CM | POA: Diagnosis not present

## 2018-02-04 NOTE — Progress Notes (Signed)
Office Visit Note   Patient: Carlos Rogers           Date of Birth: 1966-04-06           MRN: [id number] Visit Date: 02/04/2018              Requested by: Caryl Bis, MD [place], [place] [address] PCP: Caryl Bis, MD   Assessment & Plan: Visit Diagnoses:  1. 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  Surgical option would be ankle arthroscopy with debridement of anterior impinging spurs.  We discussed potential for regrowth of the spurs.  Due to stiffness in his ankle he no longer is suffering from recurrent spurs.  He has had anti-inflammatories intra-articular injections and chronic symptoms for several years worse in the last 8 months.  Plan would be outpatient anterior spur excision with Intra-Op C arm to make sure he had adequate spur resection.  Removal of loose bodies which are catching.  Procedure discussed questions were elicited and answered patient understands and requests we proceed.  Follow-Up Instructions: No follow-ups on file.   Orders:  Orders Placed This Encounter  Procedures  . XR Ankle Complete Left   No orders of the defined types were placed in this encounter.     Procedures: No procedures performed   Clinical Data: No additional findings.   Subjective: Chief Complaint  Patient presents with  . Left Ankle - Pain    HPI 52 year old male seen with recurrent left ankle pain.  He was seen in June 2017 with problems with his left ankle with history of ankle sprains greater than 12 with degenerative spurring.  He does okay riding a bicycle bone he walks he limps due to inability to dorsiflex past neutral.  He points directly over the anterior aspect of the ankle where he has pain.  He is used Advil is had injections in the past uses intermittent ice without relief.  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  No recent ankle sprains in the last year.  Original injury was playing basketball when he was a teenager.  X-rays demonstrate enlargement of spurs compared to 2017 with anterior touching impinging spurs.  Patient states that his pain has progressed it bothers him at night when he tries to sleep and when he ambulates in the community with his family.  He states he is ready for surgery.  Review of Systems positive for hypertension on chlorthalidone and Norvasc.  He is used ibuprofen and Aleve for his ankle.  History of guaiac positive stools.  Negative for cardiovascular respiratory endocrinologic 14 point review of systems otherwise negative as it pertains HPI.   Objective: Vital Signs: BP 121/73   Pulse 66   Ht 5\' 11"  (1.803 m)   Wt (!) 310 lb (140.6 kg)   BMI 43.24 kg/m   Physical Exam  Constitutional: He is oriented to person, place, and time. He appears well-developed and well-nourished.  HENT:  Head: Normocephalic and atraumatic.  Eyes: Pupils are equal, round, and reactive to light. EOM are normal.  Neck: No tracheal deviation present. No thyromegaly present.  Cardiovascular: Normal rate.  Pulmonary/Chest: Effort normal. He has no wheezes.  Abdominal: Soft. Bowel sounds are normal.  Neurological: He is alert and oriented to person, place,  and time.  Skin: Skin is warm and dry. Capillary refill takes less than 2 seconds.  Psychiatric: He has a normal mood and affect. His behavior is normal. Judgment and thought content normal.    Ortho Exam patient has normal pulses no sciatic notch tenderness normal hip and knee range of motion.  Right ankle shows 20 degrees dorsiflexion on the left ankle he dorsiflexes just to neutral with anterior ankle pain.  Normal subtalar motion.  Plantar flexion 30 degrees without pain.  No tenderness over the peroneal or posterior tibial tendon.  Midfoot forefoot motion is normal.  Ambulates with external rotation gait due to limited  dorsiflexion.  He cannot toe walk due to anterior ankle impingement.  Specialty Comments:  No specialty comments available.  Imaging: Three-view x-rays left ankle obtained and reviewed.  This shows large prominent osteophytes tibiotalar with impingement.  There is some calcification of the deltoid ligament.  2 to 3 mm loose pieces in the lateral gutter between the talus and the fibula and some lateral inferior spurring.  No syndesmotic widening.  The ankle joint space is maintained.  Impression: Degenerative spurring, loose bodies in the ankle and prominent impinging anterior spurs walking dorsiflexion.   PMFS History: Patient Active Problem List   Diagnosis Date Noted  . Guaiac positive stools 09/10/2017  . Hypertension    Past Medical History:  Diagnosis Date  . Guaiac positive stools 09/10/2017  . Hypertension   . Occult blood in stools     Family History  Problem Relation Age of Onset  . Heart disease Mother   . Hypertension Mother     Past Surgical History:  Procedure Laterality Date  . APPENDECTOMY    . CHOLECYSTECTOMY    . COLONOSCOPY N/A 10/08/2017   Procedure: COLONOSCOPY;  Surgeon: Rogene Houston, MD;  [facility];  Service: Endoscopy;  Laterality: N/A;   Social History   Occupational History  . Not on file  Tobacco Use  . Smoking status: Never Smoker  . Smokeless tobacco: Never Used  Substance and Sexual Activity  . Alcohol use: Not Currently  . Drug use: Never  . Sexual activity: Not on file

## 2018-02-04 NOTE — H&P (View-Only) (Signed)
Office Visit Note   Patient: Carlos Rogers           Date of Birth: [birth year]           MRN: [id number] Visit Date: 02/04/2018              Requested by: Caryl Bis, MD [place], [place] [address] PCP: Caryl Bis, MD   Assessment & Plan: Visit Diagnoses:  1. 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  Surgical option would be ankle arthroscopy with debridement of anterior impinging spurs.  We discussed potential for regrowth of the spurs.  Due to stiffness in his ankle he no longer is suffering from recurrent spurs.  He has had anti-inflammatories intra-articular injections and chronic symptoms for several years worse in the last 8 months.  Plan would be outpatient anterior spur excision with Intra-Op C arm to make sure he had adequate spur resection.  Removal of loose bodies which are catching.  Procedure discussed questions were elicited and answered patient understands and requests we proceed.  Follow-Up Instructions: No follow-ups on file.   Orders:  Orders Placed This Encounter  Procedures  . XR Ankle Complete Left   No orders of the defined types were placed in this encounter.     Procedures: No procedures performed   Clinical Data: No additional findings.   Subjective: Chief Complaint  Patient presents with  . Left Ankle - Pain    HPI 52 year old male seen with recurrent left ankle pain.  He was seen in June 2017 with problems with his left ankle with history of ankle sprains greater than 12 with degenerative spurring.  He does okay riding a bicycle bone he walks he limps due to inability to dorsiflex past neutral.  He points directly over the anterior aspect of the ankle where he has pain.  He is used Advil is had injections in the past uses intermittent ice without relief.  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  No recent ankle sprains in the last year.  Original injury was playing basketball when he was a teenager.  X-rays demonstrate enlargement of spurs compared to 2017 with anterior touching impinging spurs.  Patient states that his pain has progressed it bothers him at night when he tries to sleep and when he ambulates in the community with his family.  He states he is ready for surgery.  Review of Systems positive for hypertension on chlorthalidone and Norvasc.  He is used ibuprofen and Aleve for his ankle.  History of guaiac positive stools.  Negative for cardiovascular respiratory endocrinologic 14 point review of systems otherwise negative as it pertains HPI.   Objective: Vital Signs: BP 121/73   Pulse 66   Ht 5\' 11"  (1.803 m)   Wt (!) 310 lb (140.6 kg)   BMI 43.24 kg/m   Physical Exam  Constitutional: He is oriented to person, place, and time. He appears well-developed and well-nourished.  HENT:  Head: Normocephalic and atraumatic.  Eyes: Pupils are equal, round, and reactive to light. EOM are normal.  Neck: No tracheal deviation present. No thyromegaly present.  Cardiovascular: Normal rate.  Pulmonary/Chest: Effort normal. He has no wheezes.  Abdominal: Soft. Bowel sounds are normal.  Neurological: He is alert and oriented to person, place,  and time.  Skin: Skin is warm and dry. Capillary refill takes less than 2 seconds.  Psychiatric: He has a normal mood and affect. His behavior is normal. Judgment and thought content normal.    Ortho Exam patient has normal pulses no sciatic notch tenderness normal hip and knee range of motion.  Right ankle shows 20 degrees dorsiflexion on the left ankle he dorsiflexes just to neutral with anterior ankle pain.  Normal subtalar motion.  Plantar flexion 30 degrees without pain.  No tenderness over the peroneal or posterior tibial tendon.  Midfoot forefoot motion is normal.  Ambulates with external rotation gait due to limited  dorsiflexion.  He cannot toe walk due to anterior ankle impingement.  Specialty Comments:  No specialty comments available.  Imaging: Three-view x-rays left ankle obtained and reviewed.  This shows large prominent osteophytes tibiotalar with impingement.  There is some calcification of the deltoid ligament.  2 to 3 mm loose pieces in the lateral gutter between the talus and the fibula and some lateral inferior spurring.  No syndesmotic widening.  The ankle joint space is maintained.  Impression: Degenerative spurring, loose bodies in the ankle and prominent impinging anterior spurs walking dorsiflexion.   PMFS History: Patient Active Problem List   Diagnosis Date Noted  . Guaiac positive stools 09/10/2017  . Hypertension    Past Medical History:  Diagnosis Date  . Guaiac positive stools 09/10/2017  . Hypertension   . Occult blood in stools     Family History  Problem Relation Age of Onset  . Heart disease Mother   . Hypertension Mother     Past Surgical History:  Procedure Laterality Date  . APPENDECTOMY    . CHOLECYSTECTOMY    . COLONOSCOPY N/A 10/08/2017   Procedure: COLONOSCOPY;  Surgeon: Rogene Houston, MD;  Location: [facility];  Service: Endoscopy;  Laterality: N/A;   Social History   Occupational History  . Not on file  Tobacco Use  . Smoking status: Never Smoker  . Smokeless tobacco: Never Used  Substance and Sexual Activity  . Alcohol use: Not Currently  . Drug use: Never  . Sexual activity: Not on file

## 2018-02-07 ENCOUNTER — Encounter (INDEPENDENT_AMBULATORY_CARE_PROVIDER_SITE_OTHER): Payer: Self-pay | Admitting: Orthopaedic Surgery

## 2018-02-25 ENCOUNTER — Telehealth (INDEPENDENT_AMBULATORY_CARE_PROVIDER_SITE_OTHER): Payer: Self-pay | Admitting: Radiology

## 2018-02-25 NOTE — Progress Notes (Addendum)
PCP: Gar Ponto, MD  Cardiologist: pt denies  EKG: obtained today   Stress test: pt denies  ECHO: pt denies  Cardiac Cath: pt denies  Chest x-ray: pt denies past year, no recent respiratory infections/complications

## 2018-02-25 NOTE — Pre-Procedure Instructions (Signed)
JHON MALLOZZI  02/25/2018      [facility] - [address], [place], [place] - [address] [address] Phone: [phone] Fax: [phone]    Your procedure is scheduled on March 01, 2018.  Report to [facility] Admitting at 530 AM.  Call this number if you have problems the morning of surgery:  [phone]   Remember:  Do not eat or drink after midnight.    Take these medicines the morning of surgery with A SIP OF WATER  Amlodipine (norvasc)  Beginning now, STOP taking any Aspirin (unless otherwise instructed by your surgeon), Aleve, Naproxen, Ibuprofen, Motrin, Advil, Goody's, BC's, all herbal medications, fish oil, and all vitamins    Do not wear jewelry  Do not wear lotions, powders, or colognes, or deodorant.  Men may shave face and neck.  Do not bring valuables to the hospital.  [facility] is not responsible for any belongings or valuables.  Contacts, dentures or bridgework may not be worn into surgery.  Leave your suitcase in the car.  After surgery it may be brought to your room.  For patients admitted to the hospital, discharge time will be determined by your treatment team.  Patients discharged the day of surgery will not be allowed to drive home.   [facility]- Preparing For Surgery  Before surgery, you can play an important role. Because skin is not sterile, your skin needs to be as free of germs as possible. You can reduce the number of germs on your skin by washing with CHG (chlorahexidine gluconate) Soap before surgery.  CHG is an antiseptic cleaner which kills germs and bonds with the skin to continue killing germs even after washing.    Oral Hygiene is also important to reduce your risk of infection.  Remember - BRUSH YOUR TEETH THE MORNING OF SURGERY WITH YOUR REGULAR TOOTHPASTE  Please do not use if you have an allergy to CHG or antibacterial soaps. If your skin becomes reddened/irritated stop using the CHG.   Do not shave (including legs and underarms) for at least 48 hours prior to first CHG shower. It is OK to shave your face.  Please follow these instructions carefully.   1. Shower the NIGHT BEFORE SURGERY and the MORNING OF SURGERY with CHG.   2. If you chose to wash your hair, wash your hair first as usual with your normal shampoo.  3. After you shampoo, rinse your hair and body thoroughly to remove the shampoo.  4. Use CHG as you would any other liquid soap. You can apply CHG directly to the skin and wash gently with a scrungie or a clean washcloth.   5. Apply the CHG Soap to your body ONLY FROM THE NECK DOWN.  Do not use on open wounds or open sores. Avoid contact with your eyes, ears, mouth and genitals (private parts). Wash Face and genitals (private parts)  with your normal soap.  6. Wash thoroughly, paying special attention to the area where your surgery will be performed.  7. Thoroughly rinse your body with warm water from the neck down.  8. DO NOT shower/wash with your normal soap after using and rinsing off the CHG Soap.  9. Pat yourself dry with a CLEAN TOWEL.  10. Wear CLEAN PAJAMAS to bed the night before surgery, wear comfortable clothes the morning of surgery  11. Place CLEAN SHEETS on your bed the night of your first shower and DO  NOT SLEEP WITH PETS.  Day of Surgery:  Do not apply any deodorants/lotions.  Please wear clean clothes to the hospital/surgery center.   Remember to brush your teeth WITH YOUR REGULAR TOOTHPASTE.  Please read over the following fact sheets that you were given. Pain Booklet, Coughing and Deep Breathing and Surgical Site Infection Prevention

## 2018-02-25 NOTE — Telephone Encounter (Signed)
I called and spoke with Baxter Flattery to advise.  Dr. Lorin Mercy would like to be sure that the ankle traction set that is blue and sterile is available for his case.  It is small and hooks around the ankle and has foam on it.  He will use a sterile Kerlex wrapped around his waist for traction.    Dr. Lorin Mercy states April will know exactly what he is talking about.  Can you please check to be sure that they have it available for case on Monday?

## 2018-02-26 ENCOUNTER — Encounter (HOSPITAL_COMMUNITY)
Admission: RE | Admit: 2018-02-26 | Discharge: 2018-02-26 | Disposition: A | Discharge status: Home / Self Care | Payer: BLUE CROSS/BLUE SHIELD | Source: Ambulatory Visit | Attending: Orthopaedic Surgery | Admitting: Orthopaedic Surgery

## 2018-02-26 ENCOUNTER — Other Ambulatory Visit: Payer: Self-pay

## 2018-02-26 ENCOUNTER — Encounter (HOSPITAL_COMMUNITY): Payer: Self-pay

## 2018-02-26 DIAGNOSIS — Z01810 Encounter for preprocedural cardiovascular examination: Secondary | ICD-10-CM | POA: Insufficient documentation

## 2018-02-26 DIAGNOSIS — Z01812 Encounter for preprocedural laboratory examination: Secondary | ICD-10-CM | POA: Insufficient documentation

## 2018-02-26 LAB — CBC
HCT: 46.8 % (ref 39.0–52.0)
Hemoglobin: 16 g/dL (ref 13.0–17.0)
MCH: 28.3 pg (ref 26.0–34.0)
MCHC: 34.2 g/dL (ref 30.0–36.0)
MCV: 82.7 fL (ref 78.0–100.0)
Platelets: 344 10*3/uL (ref 150–400)
RBC: 5.66 MIL/uL (ref 4.22–5.81)
RDW: 13.2 % (ref 11.5–15.5)
WBC: 10 10*3/uL (ref 4.0–10.5)

## 2018-02-26 LAB — BASIC METABOLIC PANEL
Anion gap: 11 (ref 5–15)
BUN: 11 mg/dL (ref 6–20)
CALCIUM: 9.2 mg/dL (ref 8.9–10.3)
CHLORIDE: 102 mmol/L (ref 98–111)
CO2: 25 mmol/L (ref 22–32)
Creatinine, Ser: 1.16 mg/dL (ref 0.61–1.24)
GFR calc Af Amer: >60 mL/min (ref >60)
Glucose, Bld: 114 mg/dL — ABNORMAL HIGH (ref 70–99)
POTASSIUM: 3.3 mmol/L — AB (ref 3.5–5.1)
SODIUM: 138 mmol/L (ref 135–145)

## 2018-02-26 MED ORDER — DEXTROSE 5 % IV SOLN
3.0000 g | INTRAVENOUS | Status: AC
  Administered 2018-03-01: 3 g via INTRAVENOUS
  Filled 2018-02-26: qty 3

## 2018-02-26 NOTE — Progress Notes (Signed)
Received phone call from [facility] in microbiology lab.  Per Elta Guadeloupe, the patient's PCR  Has been test several times and continues to result as invalid. The patient will need to be reswabbed day of surgery and treated with betadine.  Order entered for PCR and note placed on physical chart as well.

## 2018-02-28 NOTE — Anesthesia Preprocedure Evaluation (Addendum)
Anesthesia Evaluation  Patient identified by MRN, date of birth, ID band Patient awake    Reviewed: Allergy & Precautions, NPO status , Patient's Chart, lab work & pertinent test results  History of Anesthesia Complications Negative for: history of anesthetic complications  Airway Mallampati: II  TM Distance: >3 FB Neck ROM: Full    Dental  (+) Dental Advisory Given, Teeth Intact   Pulmonary neg pulmonary ROS,    breath sounds clear to auscultation       Cardiovascular hypertension, Pt. on medications  Rhythm:Regular Rate:Normal     Neuro/Psych negative neurological ROS  negative psych ROS   GI/Hepatic negative GI ROS, Neg liver ROS,   Endo/Other  Morbid obesity  Renal/GU negative Renal ROS  negative genitourinary   Musculoskeletal negative musculoskeletal ROS (+)   Abdominal (+) + obese,   Peds  Hematology negative hematology ROS (+)   Anesthesia Other Findings   Reproductive/Obstetrics                            Anesthesia Physical Anesthesia Plan  ASA: III  Anesthesia Plan: General   Post-op Pain Management:    Induction: Intravenous  PONV Risk Score and Plan: 3 and Treatment may vary due to age or medical condition, Ondansetron, Dexamethasone and Midazolam  Airway Management Planned: LMA  Additional Equipment: None  Intra-op Plan:   Post-operative Plan: Extubation in OR  Informed Consent: I have reviewed the patients History and Physical, chart, labs and discussed the procedure including the risks, benefits and alternatives for the proposed anesthesia with the patient or authorized representative who has indicated his/her understanding and acceptance.   Dental advisory given  Plan Discussed with: CRNA and Anesthesiologist  Anesthesia Plan Comments: (Consented for saphenous and sciatic nerve blocks. Will hold off on nerve blocks until PACU if necessary.)       Anesthesia Quick Evaluation

## 2018-03-01 ENCOUNTER — Encounter (HOSPITAL_COMMUNITY)
Admission: RE | Disposition: A | Discharge status: Home / Self Care | Payer: Self-pay | Source: Ambulatory Visit | Attending: Orthopaedic Surgery

## 2018-03-01 ENCOUNTER — Encounter (HOSPITAL_COMMUNITY): Payer: Self-pay | Admitting: *Deleted

## 2018-03-01 ENCOUNTER — Ambulatory Visit (HOSPITAL_COMMUNITY)
Admission: RE | Admit: 2018-03-01 | Discharge: 2018-03-01 | Disposition: A | Discharge status: Home / Self Care | Payer: BLUE CROSS/BLUE SHIELD | Source: Ambulatory Visit | Attending: Orthopaedic Surgery | Admitting: Orthopaedic Surgery

## 2018-03-01 ENCOUNTER — Ambulatory Visit (HOSPITAL_COMMUNITY): Payer: BLUE CROSS/BLUE SHIELD | Admitting: Anesthesiology

## 2018-03-01 DIAGNOSIS — Z6841 Body Mass Index (BMI) 40.0 and over, adult: Secondary | ICD-10-CM | POA: Diagnosis not present

## 2018-03-01 DIAGNOSIS — M25872 Other specified joint disorders, left ankle and foot: Secondary | ICD-10-CM

## 2018-03-01 DIAGNOSIS — M24072 Loose body in left ankle: Secondary | ICD-10-CM | POA: Diagnosis not present

## 2018-03-01 DIAGNOSIS — I10 Essential (primary) hypertension: Secondary | ICD-10-CM | POA: Insufficient documentation

## 2018-03-01 DIAGNOSIS — M24672 Ankylosis, left ankle: Secondary | ICD-10-CM | POA: Diagnosis not present

## 2018-03-01 DIAGNOSIS — M25772 Osteophyte, left ankle: Secondary | ICD-10-CM | POA: Diagnosis not present

## 2018-03-01 DIAGNOSIS — Z79899 Other long term (current) drug therapy: Secondary | ICD-10-CM | POA: Diagnosis not present

## 2018-03-01 HISTORY — PX: ANKLE ARTHROSCOPY: SHX545

## 2018-03-01 LAB — SURGICAL PCR SCREEN
MRSA, PCR: NEGATIVE
Staphylococcus aureus: NEGATIVE

## 2018-03-01 SURGERY — ARTHROSCOPY, ANKLE
Anesthesia: General | Laterality: Left

## 2018-03-01 MED ORDER — BUPIVACAINE HCL (PF) 0.25 % IJ SOLN
INTRAMUSCULAR | Status: AC
  Filled 2018-03-01: qty 30

## 2018-03-01 MED ORDER — FENTANYL CITRATE (PF) 100 MCG/2ML IJ SOLN
INTRAMUSCULAR | Status: DC | PRN
  Administered 2018-03-01 (×4): 50 ug via INTRAVENOUS

## 2018-03-01 MED ORDER — PROPOFOL 10 MG/ML IV BOLUS
INTRAVENOUS | Status: AC
  Filled 2018-03-01: qty 20

## 2018-03-01 MED ORDER — MIDAZOLAM HCL 2 MG/2ML IJ SOLN
INTRAMUSCULAR | Status: AC
  Filled 2018-03-01: qty 2

## 2018-03-01 MED ORDER — OXYCODONE-ACETAMINOPHEN 5-325 MG PO TABS: 1.0000 | ORAL_TABLET | ORAL | 0 refills | Status: AC | PRN

## 2018-03-01 MED ORDER — OXYCODONE HCL 5 MG/5ML PO SOLN: 5.0000 mg | Freq: Once | ORAL | Status: DC | PRN

## 2018-03-01 MED ORDER — PHENYLEPHRINE 40 MCG/ML (10ML) SYRINGE FOR IV PUSH (FOR BLOOD PRESSURE SUPPORT)
PREFILLED_SYRINGE | INTRAVENOUS | Status: AC
  Filled 2018-03-01: qty 10

## 2018-03-01 MED ORDER — LIDOCAINE 2% (20 MG/ML) 5 ML SYRINGE
INTRAMUSCULAR | Status: AC
  Filled 2018-03-01: qty 5

## 2018-03-01 MED ORDER — CHLORHEXIDINE GLUCONATE 4 % EX LIQD: 60.0000 mL | Freq: Once | CUTANEOUS | Status: DC

## 2018-03-01 MED ORDER — OXYCODONE HCL 5 MG PO TABS: 5.0000 mg | ORAL_TABLET | Freq: Once | ORAL | Status: DC | PRN

## 2018-03-01 MED ORDER — PROPOFOL 10 MG/ML IV BOLUS
INTRAVENOUS | Status: DC | PRN
  Administered 2018-03-01: 200 mg via INTRAVENOUS

## 2018-03-01 MED ORDER — ONDANSETRON HCL 4 MG/2ML IJ SOLN
INTRAMUSCULAR | Status: AC
  Filled 2018-03-01: qty 2

## 2018-03-01 MED ORDER — FENTANYL CITRATE (PF) 250 MCG/5ML IJ SOLN
INTRAMUSCULAR | Status: AC
  Filled 2018-03-01: qty 5

## 2018-03-01 MED ORDER — PROMETHAZINE HCL 25 MG/ML IJ SOLN: 6.2500 mg | INTRAMUSCULAR | Status: DC | PRN

## 2018-03-01 MED ORDER — SODIUM CHLORIDE 0.9 % IR SOLN
Status: DC | PRN
  Administered 2018-03-01: 6000 mL

## 2018-03-01 MED ORDER — BUPIVACAINE HCL (PF) 0.25 % IJ SOLN
INTRAMUSCULAR | Status: DC | PRN
  Administered 2018-03-01: 10 mL

## 2018-03-01 MED ORDER — LACTATED RINGERS IV SOLN
INTRAVENOUS | Status: DC | PRN
  Administered 2018-03-01: 07:00:00 via INTRAVENOUS

## 2018-03-01 MED ORDER — BUPIVACAINE-EPINEPHRINE (PF) 0.25% -1:200000 IJ SOLN
INTRAMUSCULAR | Status: AC
  Filled 2018-03-01: qty 30

## 2018-03-01 MED ORDER — FENTANYL CITRATE (PF) 100 MCG/2ML IJ SOLN: 25.0000 ug | INTRAMUSCULAR | Status: DC | PRN

## 2018-03-01 MED ORDER — EPHEDRINE SULFATE 50 MG/ML IJ SOLN
INTRAMUSCULAR | Status: DC | PRN
  Administered 2018-03-01 (×2): 5 mg via INTRAVENOUS

## 2018-03-01 MED ORDER — OXYCODONE-ACETAMINOPHEN 5-325 MG PO TABS
1.0000 | ORAL_TABLET | ORAL | Status: DC | PRN
  Administered 2018-03-01: 1 via ORAL

## 2018-03-01 MED ORDER — DEXAMETHASONE SODIUM PHOSPHATE 10 MG/ML IJ SOLN
INTRAMUSCULAR | Status: AC
  Filled 2018-03-01: qty 1

## 2018-03-01 MED ORDER — LIDOCAINE 2% (20 MG/ML) 5 ML SYRINGE
INTRAMUSCULAR | Status: DC | PRN
  Administered 2018-03-01: 100 mg via INTRAVENOUS

## 2018-03-01 MED ORDER — ONDANSETRON HCL 4 MG/2ML IJ SOLN
INTRAMUSCULAR | Status: DC | PRN
  Administered 2018-03-01: 4 mg via INTRAVENOUS

## 2018-03-01 MED ORDER — DEXAMETHASONE SODIUM PHOSPHATE 10 MG/ML IJ SOLN
INTRAMUSCULAR | Status: DC | PRN
  Administered 2018-03-01: 10 mg via INTRAVENOUS

## 2018-03-01 MED ORDER — LACTATED RINGERS IV SOLN: INTRAVENOUS | Status: DC | PRN

## 2018-03-01 MED ORDER — PHENYLEPHRINE HCL 10 MG/ML IJ SOLN
INTRAMUSCULAR | Status: DC | PRN
  Administered 2018-03-01 (×5): 80 ug via INTRAVENOUS

## 2018-03-01 MED ORDER — MUPIROCIN 2 % EX OINT
1.0000 "application " | TOPICAL_OINTMENT | Freq: Once | CUTANEOUS | Status: AC
  Administered 2018-03-01: 1 via TOPICAL
  Filled 2018-03-01: qty 22

## 2018-03-01 MED ORDER — OXYCODONE-ACETAMINOPHEN 5-325 MG PO TABS
ORAL_TABLET | ORAL | Status: AC
  Filled 2018-03-01: qty 1

## 2018-03-01 MED ORDER — MIDAZOLAM HCL 5 MG/5ML IJ SOLN
INTRAMUSCULAR | Status: DC | PRN
  Administered 2018-03-01: 2 mg via INTRAVENOUS

## 2018-03-01 SURGICAL SUPPLY — 38 items
BANDAGE ACE 6X5 VEL STRL LF (GAUZE/BANDAGES/DRESSINGS) IMPLANT
BANDAGE ELASTIC 6 VELCRO ST LF (GAUZE/BANDAGES/DRESSINGS) ×2 IMPLANT
BLADE CUDA 4.2 (BLADE) ×2 IMPLANT
BLADE CUDA 5.5 (BLADE) IMPLANT
BLADE GREAT WHITE 4.2 (BLADE) IMPLANT
BNDG GAUZE ELAST 4 BULKY (GAUZE/BANDAGES/DRESSINGS) ×4 IMPLANT
BOOTCOVER CLEANROOM LRG (PROTECTIVE WEAR) ×4 IMPLANT
BUR OVAL 4.0 (BURR) ×2 IMPLANT
BUR OVAL 6.0 (BURR) IMPLANT
COVER SURGICAL LIGHT HANDLE (MISCELLANEOUS) ×2 IMPLANT
CUFF TOURNIQUET SINGLE 34IN LL (TOURNIQUET CUFF) IMPLANT
CUFF TOURNIQUET SINGLE 44IN (TOURNIQUET CUFF) IMPLANT
DRAPE ARTHROSCOPY W/POUCH 114 (DRAPES) ×2 IMPLANT
DRAPE C-ARM MINI 42X72 WSTRAPS (DRAPES) ×2 IMPLANT
DRSG PAD ABDOMINAL 8X10 ST (GAUZE/BANDAGES/DRESSINGS) ×2 IMPLANT
DURAPREP 26ML APPLICATOR (WOUND CARE) ×2 IMPLANT
GAUZE SPONGE 4X4 12PLY STRL (GAUZE/BANDAGES/DRESSINGS) ×2 IMPLANT
GAUZE XEROFORM 1X8 LF (GAUZE/BANDAGES/DRESSINGS) ×2 IMPLANT
GLOVE BIOGEL PI IND STRL 8 (GLOVE) ×1 IMPLANT
GLOVE BIOGEL PI INDICATOR 8 (GLOVE) ×1
GLOVE ORTHO TXT STRL SZ7.5 (GLOVE) ×2 IMPLANT
GOWN STRL REUS W/ TWL LRG LVL3 (GOWN DISPOSABLE) ×2 IMPLANT
GOWN STRL REUS W/TWL 2XL LVL3 (GOWN DISPOSABLE) IMPLANT
GOWN STRL REUS W/TWL LRG LVL3 (GOWN DISPOSABLE) ×2
KIT TURNOVER KIT B (KITS) ×2 IMPLANT
NEEDLE HYPO 25GX1X1/2 BEV (NEEDLE) IMPLANT
PACK ARTHROSCOPY DSU (CUSTOM PROCEDURE TRAY) ×2 IMPLANT
PAD ARMBOARD 7.5X6 YLW CONV (MISCELLANEOUS) ×4 IMPLANT
PADDING CAST COTTON 6X4 STRL (CAST SUPPLIES) IMPLANT
SET ARTHROSCOPY TUBING (MISCELLANEOUS) ×1
SET ARTHROSCOPY TUBING LN (MISCELLANEOUS) ×1 IMPLANT
SPONGE LAP 4X18 RFD (DISPOSABLE) ×2 IMPLANT
STRAP ANKLE FOOT DISTRACTOR (ORTHOPEDIC SUPPLIES) ×2 IMPLANT
SUT ETHILON 3 0 PS 1 (SUTURE) ×4 IMPLANT
SUT ETHILON 4 0 PS 2 18 (SUTURE) IMPLANT
SUT VIC AB 2-0 CT1 27 (SUTURE) ×1
SUT VIC AB 2-0 CT1 TAPERPNT 27 (SUTURE) ×1 IMPLANT
TOWEL OR 17X24 6PK STRL BLUE (TOWEL DISPOSABLE) ×4 IMPLANT

## 2018-03-01 NOTE — Transfer of Care (Signed)
Immediate Anesthesia Transfer of Care Note  Patient: Carlos Rogers  Procedure(s) Performed: LEFT ANKLE ARTHROSCOPY, LEFT ARTHROSCOPIC SPUR REMOVAL (Left )  Patient Location: PACU  Anesthesia Type:General  Level of Consciousness: awake, alert , oriented and sedated  Airway & Oxygen Therapy: Patient Spontanous Breathing and Patient connected to nasal cannula oxygen  Post-op Assessment: Report given to RN, Post -op Vital signs reviewed and stable and Patient moving all extremities  Post vital signs: Reviewed and stable  Last Vitals:  Vitals Value Taken Time  BP 106/67 03/01/2018  9:51 AM  Temp    Pulse 84 03/01/2018  9:53 AM  Resp 17 03/01/2018  9:53 AM  SpO2 100 % 03/01/2018  9:53 AM  Vitals shown include unvalidated device data.  Last Pain:  Vitals:   03/01/18 0646  TempSrc: Oral  PainSc:          Complications: No apparent anesthesia complications

## 2018-03-01 NOTE — Anesthesia Postprocedure Evaluation (Signed)
Anesthesia Post Note  Patient: RUSSELL QUINNEY  Procedure(s) Performed: LEFT ANKLE ARTHROSCOPY, LEFT ARTHROSCOPIC SPUR REMOVAL (Left )     Patient location during evaluation: PACU Anesthesia Type: General Level of consciousness: awake and alert Pain management: pain level controlled Vital Signs Assessment: post-procedure vital signs reviewed and stable Respiratory status: spontaneous breathing, nonlabored ventilation and respiratory function stable Cardiovascular status: blood pressure returned to baseline and stable Postop Assessment: no apparent nausea or vomiting Anesthetic complications: no    Last Vitals:  Vitals:   03/01/18 1015 03/01/18 1035  BP: 129/84 (!) 155/81  Pulse:  83  Resp:  15  Temp:    SpO2:  96%    Last Pain:  Vitals:   03/01/18 1035  TempSrc:   PainSc: 0-No pain                 Audry Pili

## 2018-03-01 NOTE — Progress Notes (Signed)
Orthopedic Tech Progress Note Patient Details:  Carlos Rogers Nov 29, 1965 [id number]  Ortho Devices Type of Ortho Device: CAM walker, Crutches Ortho Device/Splint Location: lle Ortho Device/Splint Interventions: Application   Post Interventions Patient Tolerated: Well Instructions Provided: Care of device   Aymee Fomby 03/01/2018, 11:05 AM

## 2018-03-01 NOTE — Discharge Instructions (Signed)
Crutches WBAT. Keep CAM boot on for walking . May remove to work on gentle ankle range of motion. See Dr. Lorin Mercy in one week

## 2018-03-01 NOTE — Anesthesia Procedure Notes (Signed)
Procedure Name: LMA Insertion Date/Time: 03/01/2018 7:46 AM Performed by: Scheryl Darter, CRNA Pre-anesthesia Checklist: Patient identified, Emergency Drugs available, Suction available and Patient being monitored Patient Re-evaluated:Patient Re-evaluated prior to induction Oxygen Delivery Method: Circle System Utilized Preoxygenation: Pre-oxygenation with 100% oxygen Induction Type: IV induction Ventilation: Mask ventilation without difficulty LMA: LMA inserted LMA Size: 5.0 Number of attempts: 1 Placement Confirmation: positive ETCO2 Tube secured with: Tape Dental Injury: Teeth and Oropharynx as per pre-operative assessment  Comments: LMA applied by [facility]

## 2018-03-01 NOTE — Op Note (Signed)
Preop diagnosis: Left ankle anterior osteophytes with anterior impingement.  Postop diagnosis: Same  Procedure: diagnostic and operative arthroscopy left ankle, chondroplasty, partial spur excision.  Medial arthrotomy and resection of remaining anterior tibiotalar spurs.  Surgeon: Rodell Perna, MD  Anesthesia: General +10 cc Marcaine local.  Tourniquet to 50 x 62 minutes.  Procedure after induction of anesthesia proximal thigh tourniquet timeout procedure calf leg holder prepping with DuraPrep Ancef prophylaxis extremity sheets and drapes arthroscopic pouch was applied timeout procedure.  Outlined ankle anatomy with anterior tibial tendon peroneal [place] medial lateral malleolus and the joint line with skin marker.  Marcaine infiltration medially entering the joint as well as anterolateral portal.  Anteromedial anterolateral portals were established and will switch back and forth between scope debridement 4.2 Cuda shaver and the 4.5 bur.  There was debrided in the anterior synovium for the ankle joint finally be visualized.  There are prominent spurs on both sides of the joint and using the bur spurs were excised intermittently switching portals and then the mini C arm was brought in to make sure there is adequate spur excision.  Spurs were regular and more prominent medial and the tibial talar than laterally.  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  The rest the joint showed adequate cartilage without any areas of grade III or IV chondromalacia.  Continue switching using the bur for over an hour and then C arm was brought in again there appeared to be significant spurs.  At this point would like to proceed with the medial arthrotomy for rec spur excision under direct visualization.  The vertical arthroscopic portal was extended proximally distally critical was placed across the anterior joint and using small hot hand osteotomes  rondure pituitary anterior spurs were excised.  Rondure was used for removal of the spurs as well as osteotome and then checked intermittently with the mini C arm that had been sterilely draped.  Once there is adequate resection in the ankle could be brought up to maximum dorsiflexion without anterior impingement of the spurs tourniquet was deflated irrigation.  Reapproximation's note synovium and capsule medially with 2-0 Vicryl 2-0 Vicryl subtenons tissue.  3-0 nylon interrupted skin closure 3-0 nylon in the anterolateral portal.  4 x 4's ABDs web roll and Ace wrap was applied and cam boot will be applied and the recovery room PACU.  Patient tolerated procedure well.

## 2018-03-01 NOTE — Interval H&P Note (Signed)
History and Physical Interval Note:  03/01/2018 7:24 AM  Carlos Rogers  has presented today for surgery, with the diagnosis of left ankle impingement, loose bodies  The various methods of treatment have been discussed with the patient and family. After consideration of risks, benefits and other options for treatment, the patient has consented to  Procedure(s): LEFT ANKLE ARTHROSCOPY, LEFT ARTHROSCOPIC SPUR REMOVAL (Left) as a surgical intervention .  The patient's history has been reviewed, patient examined, no change in status, stable for surgery.  I have reviewed the patient's chart and labs.  Questions were answered to the patient's satisfaction.     Marybelle Killings

## 2018-03-02 ENCOUNTER — Encounter (HOSPITAL_COMMUNITY): Payer: Self-pay | Admitting: Orthopaedic Surgery

## 2018-03-11 ENCOUNTER — Encounter (INDEPENDENT_AMBULATORY_CARE_PROVIDER_SITE_OTHER): Payer: Self-pay | Admitting: Orthopaedic Surgery

## 2018-03-11 ENCOUNTER — Ambulatory Visit (INDEPENDENT_AMBULATORY_CARE_PROVIDER_SITE_OTHER): Payer: BLUE CROSS/BLUE SHIELD | Admitting: Orthopaedic Surgery

## 2018-03-11 VITALS — BP 142/92 | HR 78 | Ht 71.0 in | Wt 310.0 lb

## 2018-03-11 DIAGNOSIS — M25872 Other specified joint disorders, left ankle and foot: Secondary | ICD-10-CM

## 2018-03-11 MED ORDER — HYDROCODONE-ACETAMINOPHEN 5-325 MG PO TABS: 1.0000 | ORAL_TABLET | Freq: Three times a day (TID) | ORAL | 0 refills | Status: DC | PRN

## 2018-03-11 NOTE — Addendum Note (Signed)
Addended by: Meyer Cory on: 03/11/2018 02:16 PM   Modules accepted: Orders

## 2018-03-11 NOTE — Progress Notes (Signed)
   Post-Op Visit Note   Patient: Carlos Rogers           Date of Birth: 1966-01-12           MRN: [id number] Visit Date: 03/11/2018 PCP: Caryl Bis, MD   Assessment & Plan: Post knee arthroscopy which was converted to mini arthrotomy for removal of large spurs anteriorly.  His joint surface actually look good.  He still has some swelling of the ankle as expected he is using a cam boot has a rolling scooter also has crutches he will remove the boot to work on ankle range of motion.  We discussed using a crutch to help stretch his ankle standing on a step leading his heel hang down.  He will elevate his foot when he is not doing his exercises.  Norco prescribed for pain.  He will add some anti-inflammatory which he has and recheck 1 week for suture removal Steri-Strip application.  Chief Complaint:  Chief Complaint  Patient presents with  . Left Ankle - Routine Post Op    03/01/18  Left ankle arthroscopy, removal of spur   Visit Diagnoses: No diagnosis found.  Plan: Return 1 week for suture removal Steri-Strip application.  Follow-Up Instructions: Return in about 1 week (around 03/18/2018).   Orders:  No orders of the defined types were placed in this encounter.  No orders of the defined types were placed in this encounter.   Imaging: No results found.  PMFS History: Patient Active Problem List   Diagnosis Date Noted  . Ankle impingement syndrome, left 03/01/2018  . Guaiac positive stools 09/10/2017  . Hypertension    Past Medical History:  Diagnosis Date  . Guaiac positive stools 09/10/2017  . Hypertension   . Occult blood in stools     Family History  Problem Relation Age of Onset  . Heart disease Mother   . Hypertension Mother     Past Surgical History:  Procedure Laterality Date  . ANKLE ARTHROSCOPY Left 03/01/2018   Procedure: LEFT ANKLE ARTHROSCOPY, LEFT ARTHROSCOPIC SPUR REMOVAL;  Surgeon: Marybelle Killings, MD;  Location: [facility];  Service: Orthopedics;   Laterality: Left;  Including Chondroplasty, Medial Arthrotomy With Osteophytectomy  . APPENDECTOMY    . CHOLECYSTECTOMY    . COLONOSCOPY N/A 10/08/2017   Procedure: COLONOSCOPY;  Surgeon: Rogene Houston, MD;  [facility];  Service: Endoscopy;  Laterality: N/A;   Social History   Occupational History  . Not on file  Tobacco Use  . Smoking status: Never Smoker  . Smokeless tobacco: Never Used  Substance and Sexual Activity  . Alcohol use: Not Currently  . Drug use: Never  . Sexual activity: Not on file

## 2018-03-12 ENCOUNTER — Telehealth (INDEPENDENT_AMBULATORY_CARE_PROVIDER_SITE_OTHER): Payer: Self-pay | Admitting: Radiology

## 2018-03-12 NOTE — Telephone Encounter (Signed)
Received call from [facility] in [place] office. Patient's wife, Anderson Malta, called and states that after visit yesterday, patient is having panic attacks one right after the other.  She does not know what is going on with him.  I advised that patient should be seen by PCP. I did advise I would send message to Dr. Lorin Mercy as well.

## 2018-03-12 NOTE — Telephone Encounter (Signed)
I called discussed.  

## 2018-03-12 NOTE — Telephone Encounter (Signed)
Was breathing fast, crying, anxious. No CP , resolved as he got outside. He is back at work now and doing better. She will talk with PCP if persists. Was on advil , no percocet since Tuesday

## 2018-03-18 ENCOUNTER — Encounter (INDEPENDENT_AMBULATORY_CARE_PROVIDER_SITE_OTHER): Payer: Self-pay | Admitting: Orthopaedic Surgery

## 2018-03-18 ENCOUNTER — Ambulatory Visit (INDEPENDENT_AMBULATORY_CARE_PROVIDER_SITE_OTHER): Payer: BLUE CROSS/BLUE SHIELD | Admitting: Orthopaedic Surgery

## 2018-03-18 VITALS — BP 124/74 | HR 78 | Ht 71.0 in | Wt 310.0 lb

## 2018-03-18 DIAGNOSIS — M25872 Other specified joint disorders, left ankle and foot: Secondary | ICD-10-CM

## 2018-03-18 NOTE — Progress Notes (Signed)
   Post-Op Visit Note   Patient: Carlos Rogers           Date of Birth: 18-Jun-1966           MRN: [id number] Visit Date: 03/18/2018 PCP: Caryl Bis, MD   Assessment & Plan: Follow-up left ankle arthroscopy converted to mini arthrotomy for prominent spur removal.  He had a little bit of weeping from the medial incision he is Artie been back at work and encouraged him to keep his foot elevated so he does not develop a fistula or possible joint infection.  I will recheck him in a month we discussed numerous ways he can keep his foot elevated with work activity.  Chief Complaint:  Chief Complaint  Patient presents with  . Left Ankle - Follow-up    03/01/18 Left ankle arthroscopy, spur removal   Visit Diagnoses:  1. Ankle impingement syndrome, left     Plan: Patient ibuprofen return 1 month elevation.  He has persistent drainage over the next couple weeks he will call me.  Follow-Up Instructions: Return in about 1 month (around 04/17/2018).   Orders:  No orders of the defined types were placed in this encounter.  No orders of the defined types were placed in this encounter.   Imaging: No results found.  PMFS History: Patient Active Problem List   Diagnosis Date Noted  . Ankle impingement syndrome, left 03/01/2018  . Guaiac positive stools 09/10/2017  . Hypertension    Past Medical History:  Diagnosis Date  . Guaiac positive stools 09/10/2017  . Hypertension   . Occult blood in stools     Family History  Problem Relation Age of Onset  . Heart disease Mother   . Hypertension Mother     Past Surgical History:  Procedure Laterality Date  . ANKLE ARTHROSCOPY Left 03/01/2018   Procedure: LEFT ANKLE ARTHROSCOPY, LEFT ARTHROSCOPIC SPUR REMOVAL;  Surgeon: Marybelle Killings, MD;  Location: [facility];  Service: Orthopedics;  Laterality: Left;  Including Chondroplasty, Medial Arthrotomy With Osteophytectomy  . APPENDECTOMY    . CHOLECYSTECTOMY    . COLONOSCOPY N/A 10/08/2017   Procedure: COLONOSCOPY;  Surgeon: Rogene Houston, MD;  [facility];  Service: Endoscopy;  Laterality: N/A;   Social History   Occupational History  . Not on file  Tobacco Use  . Smoking status: Never Smoker  . Smokeless tobacco: Never Used  Substance and Sexual Activity  . Alcohol use: Not Currently  . Drug use: Never  . Sexual activity: Not on file

## 2018-04-15 ENCOUNTER — Ambulatory Visit (INDEPENDENT_AMBULATORY_CARE_PROVIDER_SITE_OTHER): Payer: BLUE CROSS/BLUE SHIELD | Admitting: Orthopaedic Surgery

## 2018-04-15 ENCOUNTER — Encounter (INDEPENDENT_AMBULATORY_CARE_PROVIDER_SITE_OTHER): Payer: Self-pay | Admitting: Orthopaedic Surgery

## 2018-04-15 VITALS — BP 127/80 | HR 79 | Ht 71.0 in | Wt 310.0 lb

## 2018-04-15 DIAGNOSIS — M25872 Other specified joint disorders, left ankle and foot: Secondary | ICD-10-CM

## 2018-04-15 NOTE — Progress Notes (Signed)
   Post-Op Visit Note   Patient: Carlos Rogers           Date of Birth: Dec 22, 1965           MRN: [id number] Visit Date: 04/15/2018 PCP: Caryl Bis, MD   Assessment & Plan: Post left ankle impingement with arthroscopy converted to arthrotomy for removal of large spurs on 03/01/2018.  He still has stiffness in his ankle considerable swelling.  He is elevating it taking some Advil.  He can use some intermittent ice he has a ankle brace he could use as needed.  Chief Complaint:  Chief Complaint  Patient presents with  . Left Ankle - Follow-up    03/01/18 Left ankle arthroscopy, left arthroscopic spur removal   Visit Diagnoses:  1. Ankle impingement syndrome, left     Plan: Continue work on ankle range of motion gradual strengthening occasional ibuprofen we discussed maximum recommended dosage.  He could take it for a few weeks and then wean down.  He will continue to try to work on some dieting and weight loss.  I plan to recheck him in 3 months.  Follow-Up Instructions: Return in about 3 months (around 07/16/2018).   Orders:  No orders of the defined types were placed in this encounter.  No orders of the defined types were placed in this encounter.   Imaging: No results found.  PMFS History: Patient Active Problem List   Diagnosis Date Noted  . Ankle impingement syndrome, left 03/01/2018  . Guaiac positive stools 09/10/2017  . Hypertension    Past Medical History:  Diagnosis Date  . Guaiac positive stools 09/10/2017  . Hypertension   . Occult blood in stools     Family History  Problem Relation Age of Onset  . Heart disease Mother   . Hypertension Mother     Past Surgical History:  Procedure Laterality Date  . ANKLE ARTHROSCOPY Left 03/01/2018   Procedure: LEFT ANKLE ARTHROSCOPY, LEFT ARTHROSCOPIC SPUR REMOVAL;  Surgeon: Marybelle Killings, MD;  Location: [facility];  Service: Orthopedics;  Laterality: Left;  Including Chondroplasty, Medial Arthrotomy With Osteophytectomy    . APPENDECTOMY    . CHOLECYSTECTOMY    . COLONOSCOPY N/A 10/08/2017   Procedure: COLONOSCOPY;  Surgeon: Rogene Houston, MD;  [facility];  Service: Endoscopy;  Laterality: N/A;   Social History   Occupational History  . Not on file  Tobacco Use  . Smoking status: Never Smoker  . Smokeless tobacco: Never Used  Substance and Sexual Activity  . Alcohol use: Not Currently  . Drug use: Never  . Sexual activity: Not on file

## 2018-06-21 DIAGNOSIS — E782 Mixed hyperlipidemia: Secondary | ICD-10-CM | POA: Diagnosis not present

## 2018-06-21 DIAGNOSIS — I10 Essential (primary) hypertension: Secondary | ICD-10-CM | POA: Diagnosis not present

## 2018-06-21 DIAGNOSIS — E786 Lipoprotein deficiency: Secondary | ICD-10-CM | POA: Diagnosis not present

## 2018-06-21 DIAGNOSIS — R7301 Impaired fasting glucose: Secondary | ICD-10-CM | POA: Diagnosis not present

## 2018-06-24 DIAGNOSIS — I10 Essential (primary) hypertension: Secondary | ICD-10-CM | POA: Diagnosis not present

## 2018-06-24 DIAGNOSIS — E782 Mixed hyperlipidemia: Secondary | ICD-10-CM | POA: Diagnosis not present

## 2018-06-24 DIAGNOSIS — E876 Hypokalemia: Secondary | ICD-10-CM | POA: Diagnosis not present

## 2018-07-08 ENCOUNTER — Ambulatory Visit (INDEPENDENT_AMBULATORY_CARE_PROVIDER_SITE_OTHER): Payer: BLUE CROSS/BLUE SHIELD | Admitting: Orthopaedic Surgery

## 2018-07-15 ENCOUNTER — Ambulatory Visit (INDEPENDENT_AMBULATORY_CARE_PROVIDER_SITE_OTHER): Payer: BLUE CROSS/BLUE SHIELD | Admitting: Orthopaedic Surgery

## 2018-07-15 ENCOUNTER — Encounter (INDEPENDENT_AMBULATORY_CARE_PROVIDER_SITE_OTHER): Payer: Self-pay | Admitting: Orthopaedic Surgery

## 2018-07-15 ENCOUNTER — Ambulatory Visit (INDEPENDENT_AMBULATORY_CARE_PROVIDER_SITE_OTHER): Payer: BLUE CROSS/BLUE SHIELD

## 2018-07-15 VITALS — BP 120/82 | HR 82 | Ht 71.0 in | Wt 305.0 lb

## 2018-07-15 DIAGNOSIS — M25572 Pain in left ankle and joints of left foot: Secondary | ICD-10-CM

## 2018-07-15 NOTE — Progress Notes (Signed)
Office Visit Note   Patient: Carlos Rogers           Date of Birth: 1966-03-25           MRN: [id number] Visit Date: 07/15/2018              Requested by: Caryl Bis, MD [place], [place] [address] PCP: Caryl Bis, MD   Assessment & Plan: Visit Diagnoses:  1. Pain in left ankle and joints of left foot     Plan: Post anterior spur resection now with instability.  I have him use his lace up Swede-O ankle brace and we can proceed with MRI scan.  He may have some posterior tibial tendinopathy.  Office follow-up after MRI scan.  Follow-Up Instructions: No follow-ups on file.   Orders:  Orders Placed This Encounter  Procedures  . XR Ankle Complete Left   No orders of the defined types were placed in this encounter.     Procedures: No procedures performed   Clinical Data: No additional findings.   Subjective: Chief Complaint  Patient presents with  . Left Ankle - Pain    03/01/2018 Left Ankle Arthroscopy, Arthroscopic Spur Removal    HPI 53 year old male returns with ongoing problems with his left ankle.  He has not had the pain with dorsiflexion anteriorly like he had before his spur excision 03/01/2018.  He is noticed continued swelling in his ankle but at times when he turns his ankle or twists he feels sharp pain and states he has to grab something to avoid falling.  He also has pain posterior medially some over the posterior tibial tendon also some over the Achilles tendon.  Past history of multiple ankle sprains when he was younger.  He was problems with limitation of ankle dorsiflexion has been significantly improved with anterior spur excision.  He states his ankle has prevented some of his weight loss attempts and his BMI remains elevated at 42.  Denies chills or fevers.  Arthroscopy and arthrotomy incisions are well-healed.  Review of Systems months for hypertension.  Positive for positive guaiacs stools.  Otherwise 14 point systems negative other than  as mentioned in HPI.   Objective: Vital Signs: BP 120/82   Pulse 82   Ht 5\' 11"  (1.803 m)   Wt (!) 305 lb (138.3 kg)   BMI 42.54 kg/m   Physical Exam Constitutional:      Appearance: He is well-developed.  HENT:     Head: Normocephalic and atraumatic.  Eyes:     Pupils: Pupils are equal, round, and reactive to light.  Neck:     Thyroid: No thyromegaly.     Trachea: No tracheal deviation.  Cardiovascular:     Rate and Rhythm: Normal rate.  Pulmonary:     Effort: Pulmonary effort is normal.     Breath sounds: No wheezing.  Abdominal:     General: Bowel sounds are normal.     Palpations: Abdomen is soft.  Skin:    General: Skin is warm and dry.     Capillary Refill: Capillary refill takes less than 2 seconds.  Neurological:     Mental Status: He is alert and oriented to person, place, and time.  Psychiatric:        Behavior: Behavior normal.        Thought Content: Thought content normal.        Judgment: Judgment normal.     Ortho Exam denies ankle effusion.  Dorsiflexes 15 degrees improved from preop without pain.  Tenderness of the posterior tibial tendon tenderness around the Achilles tendon.  Mild tenderness over the peroneal tendons.  Specialty Comments:  No specialty comments available.  Imaging: No results found.   PMFS History: Patient Active Problem List   Diagnosis Date Noted  . Ankle impingement syndrome, left 03/01/2018  . Guaiac positive stools 09/10/2017  . Hypertension    Past Medical History:  Diagnosis Date  . Guaiac positive stools 09/10/2017  . Hypertension   . Occult blood in stools     Family History  Problem Relation Age of Onset  . Heart disease Mother   . Hypertension Mother     Past Surgical History:  Procedure Laterality Date  . ANKLE ARTHROSCOPY Left 03/01/2018   Procedure: LEFT ANKLE ARTHROSCOPY, LEFT ARTHROSCOPIC SPUR REMOVAL;  Surgeon: Marybelle Killings, MD;  Location: [facility];  Service: Orthopedics;  Laterality: Left;   Including Chondroplasty, Medial Arthrotomy With Osteophytectomy  . APPENDECTOMY    . CHOLECYSTECTOMY    . COLONOSCOPY N/A 10/08/2017   Procedure: COLONOSCOPY;  Surgeon: Rogene Houston, MD;  [facility];  Service: Endoscopy;  Laterality: N/A;   Social History   Occupational History  . Not on file  Tobacco Use  . Smoking status: Never Smoker  . Smokeless tobacco: Never Used  Substance and Sexual Activity  . Alcohol use: Not Currently  . Drug use: Never  . Sexual activity: Not on file

## 2018-07-26 ENCOUNTER — Ambulatory Visit
Admission: RE | Admit: 2018-07-26 | Discharge: 2018-07-26 | Disposition: A | Discharge status: Home / Self Care | Payer: BLUE CROSS/BLUE SHIELD | Source: Ambulatory Visit | Attending: Orthopaedic Surgery | Admitting: Orthopaedic Surgery

## 2018-07-26 DIAGNOSIS — M25572 Pain in left ankle and joints of left foot: Secondary | ICD-10-CM

## 2018-07-26 DIAGNOSIS — M19072 Primary osteoarthritis, left ankle and foot: Secondary | ICD-10-CM | POA: Diagnosis not present

## 2018-08-02 ENCOUNTER — Inpatient Hospital Stay: Admission: RE | Admit: 2018-08-02 | Payer: BLUE CROSS/BLUE SHIELD | Source: Ambulatory Visit

## 2018-08-12 ENCOUNTER — Encounter (INDEPENDENT_AMBULATORY_CARE_PROVIDER_SITE_OTHER): Payer: Self-pay | Admitting: Orthopaedic Surgery

## 2018-08-12 ENCOUNTER — Ambulatory Visit (INDEPENDENT_AMBULATORY_CARE_PROVIDER_SITE_OTHER): Payer: BLUE CROSS/BLUE SHIELD | Admitting: Orthopaedic Surgery

## 2018-08-12 VITALS — BP 128/80 | HR 73 | Ht 71.0 in | Wt 305.0 lb

## 2018-08-12 DIAGNOSIS — M25572 Pain in left ankle and joints of left foot: Secondary | ICD-10-CM

## 2018-08-12 DIAGNOSIS — M25872 Other specified joint disorders, left ankle and foot: Secondary | ICD-10-CM

## 2018-08-12 NOTE — Progress Notes (Signed)
Office Visit Note   Patient: Carlos Rogers           Date of Birth: 1966-05-30           MRN: [id number] Visit Date: 08/12/2018              Requested by: Caryl Bis, MD [place], [place] [address] PCP: Caryl Bis, MD   Assessment & Plan: Visit Diagnoses:  1. Ankle impingement syndrome, left   2. Pain in left ankle and joints of left foot     Plan: X-rays and MRI shows satisfactory resection of anterior impinging spurs.  Scar tissue is noted anteriorly with spurs were resected as expected.  He has some subcortical cyst in the medial and lateral aspect of the dome of the talus.  Only the posterior talofibular ligament is intact with tearing of the anterior and calcaneofibular ligament which are not visualized.  We had long discussion about lateral reconstruction of his anterior talofibular and calcaneofibular ligament.  He understands that this might give him some improvement but his pain may be related to the changes in the talus and increased BMI.  We will have him use some compression stockings see if that helps as well as have him find a Swede-O ankle brace and use it I plan to recheck him in 6 weeks.  Not have significant enough arthritic changes to consider ankle fusion versus total ankle. Follow-Up Instructions: No follow-ups on file.   Orders:  No orders of the defined types were placed in this encounter.  No orders of the defined types were placed in this encounter.     Procedures: No procedures performed   Clinical Data: No additional findings.   Subjective: Chief Complaint  Patient presents with  . Left Ankle - Pain, Follow-up    MRI review    HPI 53 year old male returns with ongoing problems with his left ankle.  He has pain when he walks pain when he turns or rotates.  MRI scan is been obtained and is available for review.  He has increased swelling the more he is on his foot.  Previous anterior spur excision has given him improved ankle  dorsiflexion.  He is not rolled his ankle since the surgery.  Patient is in the process of moving has not been able to find his Swede-O but states he should be able to find it shortly.  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  Review of Systems 14 point systems updated unchanged since ankle surgery 03/01/2018.   Objective: Vital Signs: BP 128/80   Pulse 73   Ht 5\' 11"  (1.803 m)   Wt (!) 305 lb (138.3 kg)   BMI 42.54 kg/m   Physical Exam Constitutional:      Appearance: He is well-developed.  HENT:     Head: Normocephalic and atraumatic.  Eyes:     Pupils: Pupils are equal, round, and reactive to light.  Neck:     Thyroid: No thyromegaly.     Trachea: No tracheal deviation.  Cardiovascular:     Rate and Rhythm: Normal rate.  Pulmonary:     Effort: Pulmonary effort is normal.     Breath sounds: No wheezing.  Abdominal:     General: Bowel sounds are normal.     Palpations: Abdomen is soft.  Skin:    General: Skin  is warm and dry.     Capillary Refill: Capillary refill takes less than 2 seconds.  Neurological:     Mental Status: He is alert and oriented to person, place, and time.  Psychiatric:        Behavior: Behavior normal.        Thought Content: Thought content normal.        Judgment: Judgment normal.     Ortho Exam MRIs reviewed shows some arthritic changes medial lateral dome of the talus, arthroscopic portals and arthrotomy incision is well-healed.  Distal pulses are intact.  There is mild ankle swelling.  Specialty Comments:  No specialty comments available.  Imaging: CLINICAL DATA:  Chronic posterior ankle pain and weakness. Ankle arthroscopy on 03/01/2018 with chondroplasty and resection of anterior tibiotalar spurs.  EXAM: MRI OF THE LEFT ANKLE WITHOUT CONTRAST  TECHNIQUE: Multiplanar, multisequence MR imaging of the  ankle was performed. No intravenous contrast was administered.  COMPARISON:  None.  FINDINGS: TENDONS  Peroneal: Normal.  Posteromedial: Normal.  Anterior: Normal.  Achilles: Normal.  Plantar Fascia: Normal.  LIGAMENTS  Lateral: The posterior talofibular ligament is intact. The anterior talofibular ligament and calcaneal fibular ligament are not identified and may be chronically torn.  Medial: Normal.  CARTILAGE  Ankle Joint: There multiple subcortical cysts in the medial and lateral aspects of the dome of the talus with small subcortical cysts in the medial malleolus and subcortical edema in the anterior aspect of the distal tibia. No joint effusion. 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 There is only slight diffuse thinning of the articular cartilage in the ankle joint.  Subtalar Joints/Sinus Tarsi: Normal.  Bones: Arthritic changes of the ankle joint as described above. Minimal dorsal spurring at the talonavicular joint.  Other: Nonspecific subcutaneous edema circumferentially at the level of the ankle and extending onto the dorsal lateral aspect of the foot.  IMPRESSION: 1. Arthritic changes of the ankle joint as described. 2. Abnormal soft tissue at the ankle joint primarily anteriorly, consistent with arthrofibrosis. 3. Anterior talofibular ligament and calcaneofibular ligament are not identified and may be chronically torn.   Electronically Signed   By: Lorriane Shire M.D.   On: 07/27/2018 09:39   PMFS History: Patient Active Problem List   Diagnosis Date Noted  . Ankle impingement syndrome, left 03/01/2018  . Guaiac positive stools 09/10/2017  . Hypertension    Past Medical History:  Diagnosis Date  . Guaiac positive stools 09/10/2017  . Hypertension   . Occult blood in stools     Family History  Problem Relation Age of Onset  . Heart  disease Mother   . Hypertension Mother     Past Surgical History:  Procedure Laterality Date  . ANKLE ARTHROSCOPY Left 03/01/2018   Procedure: LEFT ANKLE ARTHROSCOPY, LEFT ARTHROSCOPIC SPUR REMOVAL;  Surgeon: Marybelle Killings, MD;  Location: [facility];  Service: Orthopedics;  Laterality: Left;  Including Chondroplasty, Medial Arthrotomy With Osteophytectomy  . APPENDECTOMY    . CHOLECYSTECTOMY    . COLONOSCOPY N/A 10/08/2017   Procedure: COLONOSCOPY;  Surgeon: Rogene Houston, MD;  [facility];  Service: Endoscopy;  Laterality: N/A;   Social History   Occupational History  . Not on file  Tobacco Use  . Smoking status: Never Smoker  . Smokeless tobacco: Never Used  Substance and Sexual Activity  . Alcohol use: Not Currently  . Drug use: Never  . Sexual activity: Not  on file

## 2018-09-22 ENCOUNTER — Telehealth (INDEPENDENT_AMBULATORY_CARE_PROVIDER_SITE_OTHER): Payer: Self-pay | Admitting: Radiology

## 2018-09-22 NOTE — Telephone Encounter (Signed)
I left voicemail for patient requesting return call to confirm appointment for 09/23/2018 at 0930 in [place] office and to review COVID-19 screening questions.

## 2018-09-23 ENCOUNTER — Encounter (INDEPENDENT_AMBULATORY_CARE_PROVIDER_SITE_OTHER): Payer: Self-pay | Admitting: Orthopaedic Surgery

## 2018-09-23 ENCOUNTER — Other Ambulatory Visit: Payer: Self-pay

## 2018-09-23 ENCOUNTER — Ambulatory Visit (INDEPENDENT_AMBULATORY_CARE_PROVIDER_SITE_OTHER): Payer: BLUE CROSS/BLUE SHIELD | Admitting: Orthopaedic Surgery

## 2018-09-23 VITALS — Ht 71.0 in | Wt 305.0 lb

## 2018-09-23 DIAGNOSIS — Z6841 Body Mass Index (BMI) 40.0 and over, adult: Secondary | ICD-10-CM | POA: Diagnosis not present

## 2018-09-23 DIAGNOSIS — M25372 Other instability, left ankle: Secondary | ICD-10-CM

## 2018-09-23 DIAGNOSIS — M25872 Other specified joint disorders, left ankle and foot: Secondary | ICD-10-CM | POA: Diagnosis not present

## 2018-09-23 NOTE — Progress Notes (Signed)
Office Visit Note   Patient: Carlos Rogers           Date of Birth: 09-Mar-1966           MRN: [id number] Visit Date: 09/23/2018              Requested by: Caryl Bis, MD [place], [place] [address] PCP: Caryl Bis, MD   Assessment & Plan: Visit Diagnoses:  1. Ankle impingement syndrome, left   2. Left ankle instability   3. Body mass index 40.0-44.9, adult ([facility])     Plan: We discussed weight loss which would help his ankle.  BMI greater than 40.  We discussed options with lateral ankle reconstruction and explained that this does better with decreased BMI.  He understands that he would be immobilized afterwards would not be able to work for a period of time using a walker and then have to do some therapy if he decided to proceed with lateral ankle reconstruction.  His ankle range of motion is pain is improved since his spur removal and he understands with time he will tend to re-create the degenerative spurs since his ankle has some instability.  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  We discussed surgical options and surgical technique with him.  Follow-Up Instructions: Return if symptoms worsen or fail to improve.   Orders:  No orders of the defined types were placed in this encounter.  No orders of the defined types were placed in this encounter.     Procedures: No procedures performed   Clinical Data: No additional findings.   Subjective: Chief Complaint  Patient presents with  . Left Ankle - Follow-up    03/01/18 Left Ankle Arthroscopy, Spur Removal    HPI patient returns he states his left ankle is moving better since his surgery in September removing ankle spurs.  His MRI showed some chronic deficiency of the anterior talofibular and calcaneal fibular ligaments.  The posterior talofibular ligament was intact.  He has a Swede-O  brace that he wears but sometimes makes the arch of his foot sore.  He is normally wearing low top shoes does not have any hightop boots.  His job is working on [employer].  Review of Systems updated unchanged from last fall office visits.  Ankle surgery date was 03/01/2018.   Objective: Vital Signs: Ht 5\' 11"  (1.803 m)   Wt (!) 305 lb (138.3 kg)   BMI 42.54 kg/m   Physical Exam Constitutional:      Appearance: He is well-developed.  HENT:     Head: Normocephalic and atraumatic.  Eyes:     Pupils: Pupils are equal, round, and reactive to light.  Neck:     Thyroid: No thyromegaly.     Trachea: No tracheal deviation.  Cardiovascular:     Rate and Rhythm: Normal rate.  Pulmonary:     Effort: Pulmonary effort is normal.     Breath sounds: No wheezing.  Abdominal:     General: Bowel sounds are normal.     Palpations: Abdomen is soft.  Skin:    General: Skin is warm and dry.     Capillary Refill: Capillary refill takes less than 2 seconds.  Neurological:     Mental Status: He is alert and oriented to person, place, and time.  Psychiatric:  Behavior: Behavior normal.        Thought Content: Thought content normal.        Judgment: Judgment normal.     Ortho Exam well-healed incisions he is amatory without limp.  There is mild swelling of the ankle.  No limitation of supination no pain with dorsiflexion or plantarflexion. Specialty Comments:  No specialty comments available.  Imaging: No results found.   PMFS History: Patient Active Problem List   Diagnosis Date Noted  . Left ankle instability 09/23/2018  . Body mass index 40.0-44.9, adult ([facility]) 09/23/2018  . Ankle impingement syndrome, left 03/01/2018  . Guaiac positive stools 09/10/2017  . Hypertension    Past Medical History:  Diagnosis Date  . Guaiac positive stools 09/10/2017  . Hypertension   . Occult blood in stools     Family History  Problem Relation Age of Onset  . Heart disease Mother   .  Hypertension Mother     Past Surgical History:  Procedure Laterality Date  . ANKLE ARTHROSCOPY Left 03/01/2018   Procedure: LEFT ANKLE ARTHROSCOPY, LEFT ARTHROSCOPIC SPUR REMOVAL;  Surgeon: Marybelle Killings, MD;  Location: [facility];  Service: Orthopedics;  Laterality: Left;  Including Chondroplasty, Medial Arthrotomy With Osteophytectomy  . APPENDECTOMY    . CHOLECYSTECTOMY    . COLONOSCOPY N/A 10/08/2017   Procedure: COLONOSCOPY;  Surgeon: Rogene Houston, MD;  [facility];  Service: Endoscopy;  Laterality: N/A;   Social History   Occupational History  . Not on file  Tobacco Use  . Smoking status: Never Smoker  . Smokeless tobacco: Never Used  Substance and Sexual Activity  . Alcohol use: Not Currently  . Drug use: Never  . Sexual activity: Not on file

## 2018-10-08 ENCOUNTER — Other Ambulatory Visit: Payer: Self-pay

## 2018-10-08 ENCOUNTER — Ambulatory Visit: Payer: BLUE CROSS/BLUE SHIELD

## 2018-10-08 ENCOUNTER — Ambulatory Visit: Payer: BLUE CROSS/BLUE SHIELD | Admitting: Podiatry

## 2018-10-08 ENCOUNTER — Encounter: Payer: Self-pay | Admitting: Podiatry

## 2018-10-08 ENCOUNTER — Ambulatory Visit (INDEPENDENT_AMBULATORY_CARE_PROVIDER_SITE_OTHER): Payer: BLUE CROSS/BLUE SHIELD

## 2018-10-08 VITALS — Temp 97.5°F

## 2018-10-08 DIAGNOSIS — M19079 Primary osteoarthritis, unspecified ankle and foot: Secondary | ICD-10-CM

## 2018-10-08 DIAGNOSIS — M779 Enthesopathy, unspecified: Secondary | ICD-10-CM | POA: Diagnosis not present

## 2018-10-08 DIAGNOSIS — M7752 Other enthesopathy of left foot: Secondary | ICD-10-CM | POA: Diagnosis not present

## 2018-10-08 DIAGNOSIS — T148XXA Other injury of unspecified body region, initial encounter: Secondary | ICD-10-CM | POA: Diagnosis not present

## 2018-10-12 NOTE — Progress Notes (Signed)
Subjective:   Patient ID: Carlos Rogers, male   DOB: 53 y.o.   MRN: [id number]   HPI 53 year old male presents the office today for second opinion of left ankle pain.  He states that over the last 5 years he has been having a lot of pain to his ankle.  He recently underwent arthroscopy.  He states that this helped some of the motion but overall he still gets discomfort.  He states that at times his ankle feels like it locked up and hurts to pull the ankle up.  He denies any recent injury or trauma but he has a history of several ankle sprains when he was younger sports injuries.   Review of Systems  All other systems reviewed and are negative.  Past Medical History:  Diagnosis Date  . Guaiac positive stools 09/10/2017  . Hypertension   . Occult blood in stools     Past Surgical History:  Procedure Laterality Date  . ANKLE ARTHROSCOPY Left 03/01/2018   Procedure: LEFT ANKLE ARTHROSCOPY, LEFT ARTHROSCOPIC SPUR REMOVAL;  Surgeon: Marybelle Killings, MD;  Location: [facility];  Service: Orthopedics;  Laterality: Left;  Including Chondroplasty, Medial Arthrotomy With Osteophytectomy  . APPENDECTOMY    . CHOLECYSTECTOMY    . COLONOSCOPY N/A 10/08/2017   Procedure: COLONOSCOPY;  Surgeon: Rogene Houston, MD;  [facility];  Service: Endoscopy;  Laterality: N/A;     Current Outpatient Medications:  .  amLODipine (NORVASC) 10 MG tablet, Take 10 mg by mouth daily., Disp: , Rfl:  .  chlorthalidone (HYGROTON) 25 MG tablet, Take 25 mg by mouth daily., Disp: , Rfl:  .  HYDROcodone-acetaminophen (NORCO) 5-325 MG tablet, Take 1-2 tablets by mouth every 8 (eight) hours as needed for moderate pain. (Patient not taking: Reported on 04/15/2018), Disp: 30 tablet, Rfl: 0 .  ibuprofen (ADVIL,MOTRIN) 200 MG tablet, Take 600-800 mg by mouth daily as needed for moderate pain. , Disp: 30 tablet, Rfl: 0 .  oxyCODONE-acetaminophen (PERCOCET) 5-325 MG tablet, Take 1 tablet by mouth every 4 (four) hours as needed  for severe pain. (Patient not taking: Reported on 03/18/2018), Disp: 40 tablet, Rfl: 0 .  potassium chloride SA (K-DUR,KLOR-CON) 20 MEQ tablet, Take 20 mEq by mouth daily. , Disp: , Rfl: 3 .  sildenafil (REVATIO) 20 MG tablet, Take 60-80 mg by mouth daily as needed (for erectile dysfunction). , Disp: , Rfl:   No Known Allergies       Objective:  Physical Exam  Past Medical History:  Diagnosis Date  . Guaiac positive stools 09/10/2017  . Hypertension   . Occult blood in stools     Past Surgical History:  Procedure Laterality Date  . ANKLE ARTHROSCOPY Left 03/01/2018   Procedure: LEFT ANKLE ARTHROSCOPY, LEFT ARTHROSCOPIC SPUR REMOVAL;  Surgeon: Marybelle Killings, MD;  Location: [facility];  Service: Orthopedics;  Laterality: Left;  Including Chondroplasty, Medial Arthrotomy With Osteophytectomy  . APPENDECTOMY    . CHOLECYSTECTOMY    . COLONOSCOPY N/A 10/08/2017   Procedure: COLONOSCOPY;  Surgeon: Rogene Houston, MD;  [facility];  Service: Endoscopy;  Laterality: N/A;     Current Outpatient Medications:  .  amLODipine (NORVASC) 10 MG tablet, Take 10 mg by mouth daily., Disp: , Rfl:  .  chlorthalidone (HYGROTON) 25 MG tablet, Take 25 mg by mouth daily., Disp: , Rfl:  .  HYDROcodone-acetaminophen (NORCO) 5-325 MG tablet, Take 1-2 tablets by mouth every 8 (eight) hours as needed for moderate pain. (  Patient not taking: Reported on 04/15/2018), Disp: 30 tablet, Rfl: 0 .  ibuprofen (ADVIL,MOTRIN) 200 MG tablet, Take 600-800 mg by mouth daily as needed for moderate pain. , Disp: 30 tablet, Rfl: 0 .  oxyCODONE-acetaminophen (PERCOCET) 5-325 MG tablet, Take 1 tablet by mouth every 4 (four) hours as needed for severe pain. (Patient not taking: Reported on 03/18/2018), Disp: 40 tablet, Rfl: 0 .  potassium chloride SA (K-DUR,KLOR-CON) 20 MEQ tablet, Take 20 mEq by mouth daily. , Disp: , Rfl: 3 .  sildenafil (REVATIO) 20 MG tablet, Take 60-80 mg by mouth daily as needed (for erectile  dysfunction). , Disp: , Rfl:   No Known Allergies       Assessment:  General: AAO x3, NAD  Dermatological: Skin is warm, dry and supple bilateral. Nails x 10 are well manicured; remaining integument appears unremarkable at this time. There are no open sores, no preulcerative lesions, no rash or signs of infection present.  Vascular: Dorsalis Pedis artery and Posterior Tibial artery pedal pulses are 2/4 bilateral with immedate capillary fill time. There is no pain with calf compression, swelling, warmth, erythema.   Neruologic: Grossly intact via light touch bilateral. Protective threshold with Semmes Wienstein monofilament intact to all pedal sites bilateral.   Musculoskeletal: There is tenderness palpation mostly on the lateral aspect the ankle in the course of the lateral umbilicus as well as mildly to the peroneal tendon posterior to the right malleolus.  He describes a catching sensation to his ankle in dorsiflexion.  There is tenderness along dorsiflexion the pain.  There is mild edema of the left ankle joint compared to contralateral extremity there is no erythema.  Muscular strength 5/5 in all groups tested bilateral.  Gait: Unassisted, Nonantalgic.      Plan:   Left ankle arthritis, chronic ankle ligament tears/instability -Treatment options discussed including all alternatives, risks, and complications -Etiology of symptoms were discussed -X-rays were obtained and reviewed with the patient. Arthritic changes present at the ankle but there is no evidence of acute fracture. -Reviewed previous MRI is well with him. -We had a long discussion regards to treatment options both conservatively as well surgically.  We discussed controlling his BMI before pursuing any further surgery for better outcomes.  We discussed lateral ankle stabilization also concerned about ankle catching and pain with dorsiflexion I believe the ankle arthritis is part of his issue as well. I do think this surgery  would be helpful for him however may not alleviate all of his pain given the ankle arthritis. -Conservatively we discussed other options including [place] brace long-term if needed, physical therapy. -He will consider his options.   Trula Slade DPM

## 2018-12-22 DIAGNOSIS — R7301 Impaired fasting glucose: Secondary | ICD-10-CM | POA: Diagnosis not present

## 2018-12-22 DIAGNOSIS — I10 Essential (primary) hypertension: Secondary | ICD-10-CM | POA: Diagnosis not present

## 2018-12-22 DIAGNOSIS — E876 Hypokalemia: Secondary | ICD-10-CM | POA: Diagnosis not present

## 2018-12-22 DIAGNOSIS — E782 Mixed hyperlipidemia: Secondary | ICD-10-CM | POA: Diagnosis not present

## 2018-12-28 DIAGNOSIS — I10 Essential (primary) hypertension: Secondary | ICD-10-CM | POA: Diagnosis not present

## 2018-12-28 DIAGNOSIS — E782 Mixed hyperlipidemia: Secondary | ICD-10-CM | POA: Diagnosis not present

## 2018-12-28 DIAGNOSIS — Z0001 Encounter for general adult medical examination with abnormal findings: Secondary | ICD-10-CM | POA: Diagnosis not present

## 2018-12-28 DIAGNOSIS — E876 Hypokalemia: Secondary | ICD-10-CM | POA: Diagnosis not present

## 2019-02-24 DIAGNOSIS — Z20828 Contact with and (suspected) exposure to other viral communicable diseases: Secondary | ICD-10-CM | POA: Diagnosis not present

## 2019-02-26 ENCOUNTER — Other Ambulatory Visit: Payer: Self-pay

## 2019-02-26 ENCOUNTER — Emergency Department (HOSPITAL_COMMUNITY): Payer: BC Managed Care – PPO

## 2019-02-26 ENCOUNTER — Encounter (HOSPITAL_COMMUNITY): Payer: Self-pay | Admitting: Emergency Medicine

## 2019-02-26 ENCOUNTER — Emergency Department (HOSPITAL_COMMUNITY)
Admission: EM | Admit: 2019-02-26 | Discharge: 2019-02-26 | Disposition: A | Discharge status: Home / Self Care | Payer: BC Managed Care – PPO | Attending: Emergency Medicine | Admitting: Emergency Medicine

## 2019-02-26 DIAGNOSIS — N179 Acute kidney failure, unspecified: Secondary | ICD-10-CM | POA: Diagnosis not present

## 2019-02-26 DIAGNOSIS — E860 Dehydration: Secondary | ICD-10-CM

## 2019-02-26 DIAGNOSIS — U071 COVID-19: Secondary | ICD-10-CM | POA: Insufficient documentation

## 2019-02-26 DIAGNOSIS — Z79899 Other long term (current) drug therapy: Secondary | ICD-10-CM | POA: Insufficient documentation

## 2019-02-26 DIAGNOSIS — I10 Essential (primary) hypertension: Secondary | ICD-10-CM | POA: Diagnosis not present

## 2019-02-26 DIAGNOSIS — R509 Fever, unspecified: Secondary | ICD-10-CM | POA: Diagnosis not present

## 2019-02-26 DIAGNOSIS — E876 Hypokalemia: Secondary | ICD-10-CM

## 2019-02-26 LAB — CBC WITH DIFFERENTIAL/PLATELET
Abs Immature Granulocytes: 0.03 10*3/uL (ref 0.00–0.07)
Basophils Absolute: 0 10*3/uL (ref 0.0–0.1)
Basophils Relative: 0 %
Eosinophils Absolute: 0 10*3/uL (ref 0.0–0.5)
Eosinophils Relative: 0 %
HCT: 49.6 % (ref 39.0–52.0)
Hemoglobin: 16.9 g/dL (ref 13.0–17.0)
Immature Granulocytes: 1 %
Lymphocytes Relative: 17 %
Lymphs Abs: 1.1 10*3/uL (ref 0.7–4.0)
MCH: 27.9 pg (ref 26.0–34.0)
MCHC: 34.1 g/dL (ref 30.0–36.0)
MCV: 81.8 fL (ref 80.0–100.0)
Monocytes Absolute: 0.4 10*3/uL (ref 0.1–1.0)
Monocytes Relative: 6 %
Neutro Abs: 4.9 10*3/uL (ref 1.7–7.7)
Neutrophils Relative %: 76 %
Platelets: 245 10*3/uL (ref 150–400)
RBC: 6.06 MIL/uL — ABNORMAL HIGH (ref 4.22–5.81)
RDW: 13.2 % (ref 11.5–15.5)
WBC: 6.4 10*3/uL (ref 4.0–10.5)
nRBC: 0 % (ref 0.0–0.2)

## 2019-02-26 LAB — COMPREHENSIVE METABOLIC PANEL
ALT: 32 U/L (ref 0–44)
AST: 37 U/L (ref 15–41)
Albumin: 4 g/dL (ref 3.5–5.0)
Alkaline Phosphatase: 73 U/L (ref 38–126)
Anion gap: 13 (ref 5–15)
BUN: 23 mg/dL — ABNORMAL HIGH (ref 6–20)
CO2: 29 mmol/L (ref 22–32)
Calcium: 8.3 mg/dL — ABNORMAL LOW (ref 8.9–10.3)
Chloride: 95 mmol/L — ABNORMAL LOW (ref 98–111)
Creatinine, Ser: 1.62 mg/dL — ABNORMAL HIGH (ref 0.61–1.24)
GFR calc Af Amer: 56 mL/min — ABNORMAL LOW (ref >60)
GFR calc non Af Amer: 48 mL/min — ABNORMAL LOW (ref >60)
Glucose, Bld: 110 mg/dL — ABNORMAL HIGH (ref 70–99)
Potassium: 2.6 mmol/L — CL (ref 3.5–5.1)
Sodium: 137 mmol/L (ref 135–145)
Total Bilirubin: 1.1 mg/dL (ref 0.3–1.2)
Total Protein: 7.6 g/dL (ref 6.5–8.1)

## 2019-02-26 LAB — LIPASE, BLOOD: Lipase: 44 U/L (ref 11–51)

## 2019-02-26 LAB — I-STAT CHEM 8, ED
BUN: 20 mg/dL (ref 6–20)
Calcium, Ion: 0.99 mmol/L — ABNORMAL LOW (ref 1.15–1.40)
Chloride: 97 mmol/L — ABNORMAL LOW (ref 98–111)
Creatinine, Ser: 1.4 mg/dL — ABNORMAL HIGH (ref 0.61–1.24)
Glucose, Bld: 110 mg/dL — ABNORMAL HIGH (ref 70–99)
HCT: 41 % (ref 39.0–52.0)
Hemoglobin: 13.9 g/dL (ref 13.0–17.0)
Potassium: 2.6 mmol/L — CL (ref 3.5–5.1)
Sodium: 139 mmol/L (ref 135–145)
TCO2: 26 mmol/L (ref 22–32)

## 2019-02-26 LAB — SARS CORONAVIRUS 2 BY RT PCR (HOSPITAL ORDER, PERFORMED IN ~~LOC~~ HOSPITAL LAB): SARS Coronavirus 2: POSITIVE — AB

## 2019-02-26 MED ORDER — KETOROLAC TROMETHAMINE 30 MG/ML IJ SOLN
30.0000 mg | Freq: Once | INTRAMUSCULAR | Status: AC
  Administered 2019-02-26: 15:00:00 30 mg via INTRAVENOUS
  Filled 2019-02-26: qty 1

## 2019-02-26 MED ORDER — POTASSIUM CHLORIDE 10 MEQ/100ML IV SOLN
10.0000 meq | Freq: Once | INTRAVENOUS | Status: AC
  Administered 2019-02-26: 10 meq via INTRAVENOUS
  Filled 2019-02-26: qty 100

## 2019-02-26 MED ORDER — SODIUM CHLORIDE 0.9 % IV BOLUS
1000.0000 mL | Freq: Once | INTRAVENOUS | Status: AC
  Administered 2019-02-26: 1000 mL via INTRAVENOUS

## 2019-02-26 MED ORDER — ACETAMINOPHEN 500 MG PO TABS
1000.0000 mg | ORAL_TABLET | Freq: Once | ORAL | Status: AC
  Administered 2019-02-26: 1000 mg via ORAL
  Filled 2019-02-26: qty 2

## 2019-02-26 MED ORDER — POTASSIUM CHLORIDE CRYS ER 20 MEQ PO TBCR
40.0000 meq | EXTENDED_RELEASE_TABLET | Freq: Once | ORAL | Status: AC
  Administered 2019-02-26: 40 meq via ORAL
  Filled 2019-02-26: qty 2

## 2019-02-26 MED ORDER — ONDANSETRON HCL 4 MG/2ML IJ SOLN
4.0000 mg | Freq: Once | INTRAMUSCULAR | Status: AC
  Administered 2019-02-26: 4 mg via INTRAVENOUS
  Filled 2019-02-26: qty 2

## 2019-02-26 MED ORDER — POTASSIUM CHLORIDE 10 MEQ/100ML IV SOLN
10.0000 meq | Freq: Once | INTRAVENOUS | Status: AC
  Administered 2019-02-26: 19:00:00 10 meq via INTRAVENOUS
  Filled 2019-02-26: qty 100

## 2019-02-26 MED ORDER — POTASSIUM CHLORIDE ER 20 MEQ PO TBCR: 40.0000 meq | EXTENDED_RELEASE_TABLET | Freq: Every day | ORAL | 0 refills | Status: DC

## 2019-02-26 NOTE — ED Triage Notes (Addendum)
Patient c/o fever with body aches, nausea, vomiting, and diarrhea x1 week. Per patient fatigue and lose appetite. Decrease in smell and taste. Patient works in [employer] (medical) and is unsure of any contact with Covid. Patient denies any cough or shortness of breath. Patient taking advil for fever last took advil at 4am this morning.

## 2019-02-26 NOTE — Discharge Instructions (Signed)
You can take Tylenol or Ibuprofen as directed for pain. You can alternate Tylenol and Ibuprofen every 4 hours. If you take Tylenol at 1pm, then you can take Ibuprofen at 5pm. Then you can take Tylenol again at 9pm.   As we discussed, your potassium was low today.  We have given you repletion here in the ED but you still need to take potassium pills as directed.  Take 40 mEq of potassium a day.  Make sure you are staying hydrated and drinking plenty of fluids.  As we discussed, you should quarantine herself for 14 days.  Follow-up with your primary care doctor.  Return to the emergency department for any difficulty breathing, chest pain, vomiting, any other worsening or concerning symptoms.

## 2019-02-26 NOTE — ED Notes (Signed)
Date and time results received: 02/26/19 3:34 PM  (use smartphrase ".now" to insert current time)  Test: Potassium Critical Value: 2.6  Name of Provider Notified: Bero  Orders Received? Or Actions Taken?: Orders Received - See Orders for details

## 2019-02-26 NOTE — ED Notes (Signed)
Date and time results received: 02/26/19 1700 (use smartphrase ".now" to insert current time)  Test: covid Critical Value: positive  Name of Provider Notified: lidsey pa   Orders Received? Or Actions Taken?: see chart

## 2019-02-26 NOTE — ED Provider Notes (Signed)
[facility] EMERGENCY DEPARTMENT Provider Note   CSN: [id number] Arrival date & time: 02/26/19  1343     History   Chief Complaint Chief Complaint  Patient presents with  . Fever    HPI Carlos Rogers is a 53 y.o. male with past medical history of hypertension who presents for evaluation of fevers, generalized body aches, nausea that is been ongoing x5 days.  He states that over the last 5 days, he has had fever/chills.  He notes his temperatures have been anywhere between 101-102.  He has been taking Advil for fevers.  His last dose was about 4 AM this morning.  He also states he has had generalized body aches/myalgias and has had no energy.  He states he has had nausea and decreased appetite and states that he does not feel like eating anything.  He states he had one episode of dry heaving earlier today.  He states he feels like his abdomen is "in knots" from not eating but denies any pain.  He states he is also had episodes of diarrhea but denies any bloody stools.  He reports decrease in smell and taste.  He recently went to [facility] but 1 week ago and returned about 3 days ago.  He was seen by his PCP initially after returning and had a cover test that was negative.  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  He denies any chest pain, cough, shortness of breath, urinary complaints.     The history is provided by the patient.    Past Medical History:  Diagnosis Date  . Guaiac positive stools 09/10/2017  . Hypertension   . Occult blood in stools     Patient Active Problem List   Diagnosis Date Noted  . Left ankle instability 09/23/2018  . Body mass index 40.0-44.9, adult ([place]) 09/23/2018  . Ankle impingement syndrome, left 03/01/2018  . Guaiac positive stools 09/10/2017  . Hypertension     Past Surgical History:  Procedure Laterality Date  . ANKLE ARTHROSCOPY Left 03/01/2018   Procedure: LEFT ANKLE ARTHROSCOPY, LEFT ARTHROSCOPIC SPUR  REMOVAL;  Surgeon: Marybelle Killings, MD;  Location: [place];  Service: Orthopedics;  Laterality: Left;  Including Chondroplasty, Medial Arthrotomy With Osteophytectomy  . APPENDECTOMY    . CHOLECYSTECTOMY    . COLONOSCOPY N/A 10/08/2017   Procedure: COLONOSCOPY;  Surgeon: Rogene Houston, MD;  [facility];  Service: Endoscopy;  Laterality: N/A;        Home Medications    Prior to Admission medications   Medication Sig Start Date End Date Taking? Authorizing Provider  amLODipine (NORVASC) 10 MG tablet Take 10 mg by mouth daily.   Yes [provider]  chlorthalidone (HYGROTON) 25 MG tablet Take 25 mg by mouth daily.   Yes [provider]  ibuprofen (ADVIL,MOTRIN) 200 MG tablet Take 600-800 mg by mouth daily as needed for moderate pain.  10/13/17  Yes Rehman, Mechele Dawley, MD  potassium chloride SA (K-DUR,KLOR-CON) 20 MEQ tablet Take 20 mEq by mouth daily.  12/21/17  Yes [provider]  HYDROcodone-acetaminophen (NORCO) 5-325 MG tablet Take 1-2 tablets by mouth every 8 (eight) hours as needed for moderate pain. Patient not taking: Reported on 04/15/2018 03/11/18   Marybelle Killings, MD  oxyCODONE-acetaminophen (PERCOCET) 5-325 MG tablet Take 1 tablet by mouth every 4 (four) hours as needed for severe pain. Patient not taking: Reported on 03/18/2018 03/01/18 03/01/19  Marybelle Killings, MD  potassium chloride 20 MEQ TBCR Take 40 mEq by mouth daily for 7 days. 02/26/19 03/05/19  Providence Lanius A, PA-C  sildenafil (REVATIO) 20 MG tablet Take 60-80 mg by mouth daily as needed (for erectile dysfunction).     [provider]    Family History Family History  Problem Relation Age of Onset  . Heart disease Mother   . Hypertension Mother     Social History Social History   Tobacco Use  . Smoking status: Never Smoker  . Smokeless tobacco: Never Used  Substance Use Topics  . Alcohol use: Not Currently  . Drug use: Never     Allergies   Patient has no known  allergies.   Review of Systems Review of Systems  Constitutional: Positive for appetite change, chills and fever.  Respiratory: Negative for cough and shortness of breath.   Cardiovascular: Negative for chest pain.  Gastrointestinal: Positive for abdominal pain, diarrhea, nausea and vomiting.  Genitourinary: Negative for dysuria and hematuria.  Musculoskeletal: Positive for myalgias.  Neurological: Negative for headaches.  All other systems reviewed and are negative.    Physical Exam Updated Vital Signs BP (!) 135/94   Pulse 70   Temp 98.8 F (37.1 C) (Oral)   Resp 16   Ht 5' 11.5" (1.816 m)   Wt (!) 142.9 kg   SpO2 96%   BMI 43.32 kg/m   Physical Exam Vitals signs and nursing note reviewed.  Constitutional:      Appearance: Normal appearance. He is well-developed.     Comments: Appears uncomfortable but no acute distress.  HENT:     Head: Normocephalic and atraumatic.  Eyes:     General: Lids are normal.     Conjunctiva/sclera: Conjunctivae normal.     Pupils: Pupils are equal, round, and reactive to light.  Neck:     Musculoskeletal: Full passive range of motion without pain.  Cardiovascular:     Rate and Rhythm: Normal rate and regular rhythm.     Pulses: Normal pulses.     Heart sounds: Normal heart sounds. No murmur. No friction rub. No gallop.   Pulmonary:     Effort: Pulmonary effort is normal.     Breath sounds: Normal breath sounds.     Comments: Lungs clear to auscultation bilaterally.  Symmetric chest rise.  No wheezing, rales, rhonchi. No evidence of respiratory distress.  Abdominal:     Palpations: Abdomen is soft. Abdomen is not rigid.     Tenderness: There is no abdominal tenderness. There is no guarding.     Comments: Abdomen is soft, nondistended.  Generalized tenderness no focal point.  No tenderness noted at McBurney's point.  Musculoskeletal: Normal range of motion.  Skin:    General: Skin is warm and dry.     Capillary Refill: Capillary  refill takes less than 2 seconds.  Neurological:     Mental Status: He is alert and oriented to person, place, and time.  Psychiatric:        Speech: Speech normal.      ED Treatments / Results  Labs (all labs ordered are listed, but only abnormal results are displayed) Labs Reviewed  SARS CORONAVIRUS 2 ([facility]) - Abnormal; Notable for the following components:      Result Value   SARS Coronavirus 2 POSITIVE (*)    All other components within normal limits  COMPREHENSIVE METABOLIC PANEL - Abnormal; Notable for the following components:  Potassium 2.6 (*)    Chloride 95 (*)    Glucose, Bld 110 (*)    BUN 23 (*)    Creatinine, Ser 1.62 (*)    Calcium 8.3 (*)    GFR calc non Af Amer 48 (*)    GFR calc Af Amer 56 (*)    All other components within normal limits  CBC WITH DIFFERENTIAL/PLATELET - Abnormal; Notable for the following components:   RBC 6.06 (*)    All other components within normal limits  I-STAT CHEM 8, ED - Abnormal; Notable for the following components:   Potassium 2.6 (*)    Chloride 97 (*)    Creatinine, Ser 1.40 (*)    Glucose, Bld 110 (*)    Calcium, Ion 0.99 (*)    All other components within normal limits  LIPASE, BLOOD  URINALYSIS, ROUTINE W REFLEX MICROSCOPIC    EKG EKG Interpretation  Date/Time:  Saturday February 26 2019 16:03:59 EDT Ventricular Rate:  77 PR Interval:    QRS Duration: 96 QT Interval:  445 QTC Calculation: 504 R Axis:   -24 Text Interpretation:  Sinus rhythm Borderline left axis deviation Abnormal R-wave progression, late transition Borderline T wave abnormalities Prolonged QT interval Confirmed by Gerlene Fee [phone]) on 02/26/2019 4:09:45 PM   Radiology Dg Chest Portable 1 View  Result Date: 02/26/2019 CLINICAL DATA:  Per Triage Note: Patient c/o fever with body aches, nausea, vomiting, and diarrhea x1 week. Per patient fatigue and loss of appetite. Decrease in smell and  taste. Patient works in [employer] (medical) and is unsure of any contact with Covid. EXAM: PORTABLE CHEST 1 VIEW COMPARISON:  None. FINDINGS: Shallow lung inflation. Heart size is normal. There are no focal consolidations or pleural effusions. No pulmonary edema. IMPRESSION: No evidence for acute cardiopulmonary abnormality. Electronically Signed   By: Nolon Nations M.D.   On: 02/26/2019 15:27    Procedures Procedures (including critical care time)  Medications Ordered in ED Medications  sodium chloride 0.9 % bolus 1,000 mL (0 mLs Intravenous Stopped 02/26/19 1605)  acetaminophen (TYLENOL) tablet 1,000 mg (1,000 mg Oral Given 02/26/19 1441)  ketorolac (TORADOL) 30 MG/ML injection 30 mg (30 mg Intravenous Given 02/26/19 1441)  potassium chloride 10 mEq in 100 mL IVPB (0 mEq Intravenous Stopped 02/26/19 1707)  potassium chloride SA (K-DUR) CR tablet 40 mEq (40 mEq Oral Given 02/26/19 1554)  ondansetron (ZOFRAN) injection 4 mg (4 mg Intravenous Given 02/26/19 1555)  sodium chloride 0.9 % bolus 1,000 mL (0 mLs Intravenous Stopped 02/26/19 2019)  potassium chloride 10 mEq in 100 mL IVPB (0 mEq Intravenous Stopped 02/26/19 2019)     Initial Impression / Assessment and Plan / ED Course  I have reviewed the triage vital signs and the nursing notes.  Pertinent labs & imaging results that were available during my care of the patient were reviewed by me and considered in my medical decision making (see chart for details).        53 year old male who presents for evaluation of fever, generalized body aches, myalgias x5 days.  Recent travel to [facility].  No chest pain, difficulty breathing.  On initial ED arrival, he is febrile but normal heart rate.  O2 sats are between 90-91.  When I talked to him, he would drop to about 87-88% on room air.  He was placed on 2 L O2 which bumped him up to 94%.  On exam, no evidence of respiratory distress.  He does have some generalized abdominal tenderness.  Consider viral  illness/COVID-19 versus pneumonia.  Low suspicion for intra-abdominal process but also consideration.  Plan to check labs, chest x-ray.  CBC shows no leukocytosis.  Hemoglobin stable.  Lipase unremarkable.  CMP shows potassium of 2.6, BUN of 23, creatinine 1.62. Plan for repletion. CXR negative for any abnormality.  Patient is COVID positive.  Discussed results with patient.  He reports feeling better after fluids.  He still is not having any shortness of breath.  I removed his nasal cannula and he stayed around 93-94% on room air.  I discussed that given his hypokalemia, dehydration as well as oxygen, I feel that he may need admission for further evaluation.  After extensive discussion, patient does not want to be admission admitted.  I discussed with him that we will give him an additional liter of fluids and take him off the nasal cannula to assess his O2 status and then decide on admission versus discharge.  Patient has been off of oxygen and is maintaining sats between 90-95.  His sats have not dropped below 91%.  He denies any shortness of breath.  His repeat Chem-8 still shows a potassium at 2.6 and so an IV repletion was ordered again.  Creatinine had improved to 1.4.  I discussed repeat results with patient.  He is resting comfortably and has been able to tolerate p.o. without any difficulty.  Again discussed with him regarding admission and offered admission but patient does not want to be admitted at this time.  Vitals are stable, he has no difficulty breathing.  I discussed with him that he will need potassium to go home with and he is agreeable. Discussed patient with Dr. Sedonia Small who is agreeable to plan.  At this time, patient exhibits no emergent life-threatening condition that require further evaluation in ED or admission. Patient had ample opportunity for questions and discussion. All patient's questions were answered with full understanding. Strict return precautions discussed. Patient expresses  understanding and agreement to plan.   Portions of this note were generated with [employer]. Dictation errors may occur despite best attempts at proofreading.   Final Clinical Impressions(s) / ED Diagnoses   Final diagnoses:  [id number] virus detected  Hypokalemia  Dehydration  Acute kidney injury [facility])    ED Discharge Orders         Ordered    potassium chloride 20 MEQ TBCR  Daily     02/26/19 1957           Desma Mcgregor 02/26/19 2024    Maudie Flakes, MD 03/02/19 [phone]

## 2019-06-23 DIAGNOSIS — R7301 Impaired fasting glucose: Secondary | ICD-10-CM | POA: Diagnosis not present

## 2019-06-23 DIAGNOSIS — I10 Essential (primary) hypertension: Secondary | ICD-10-CM | POA: Diagnosis not present

## 2019-06-23 DIAGNOSIS — E782 Mixed hyperlipidemia: Secondary | ICD-10-CM | POA: Diagnosis not present

## 2019-06-28 DIAGNOSIS — E782 Mixed hyperlipidemia: Secondary | ICD-10-CM | POA: Diagnosis not present

## 2019-06-28 DIAGNOSIS — Z1331 Encounter for screening for depression: Secondary | ICD-10-CM | POA: Diagnosis not present

## 2019-06-28 DIAGNOSIS — I10 Essential (primary) hypertension: Secondary | ICD-10-CM | POA: Diagnosis not present

## 2019-06-28 DIAGNOSIS — E876 Hypokalemia: Secondary | ICD-10-CM | POA: Diagnosis not present

## 2019-06-28 DIAGNOSIS — Z1389 Encounter for screening for other disorder: Secondary | ICD-10-CM | POA: Diagnosis not present

## 2020-01-29 IMAGING — MR MR ANKLE*L* W/O CM
4 of 5 series · 13 of 40 positions shown · non-contrast
Comparison: None.

CLINICAL DATA: Chronic posterior ankle pain and weakness. Ankle
arthroscopy on 03/01/2018 with chondroplasty and resection of
anterior tibiotalar spurs.

EXAM:
MRI OF THE LEFT ANKLE WITHOUT CONTRAST
TECHNIQUE: Multiplanar, multisequence MR imaging of the ankle was performed. No
intravenous contrast was administered.

[Series 4: PD fat-sat · axial · left · 3.0mm · 0.25mm/px · z∈[-72,+52]mm · 4 of 36 slices shown]
[im 1/36]
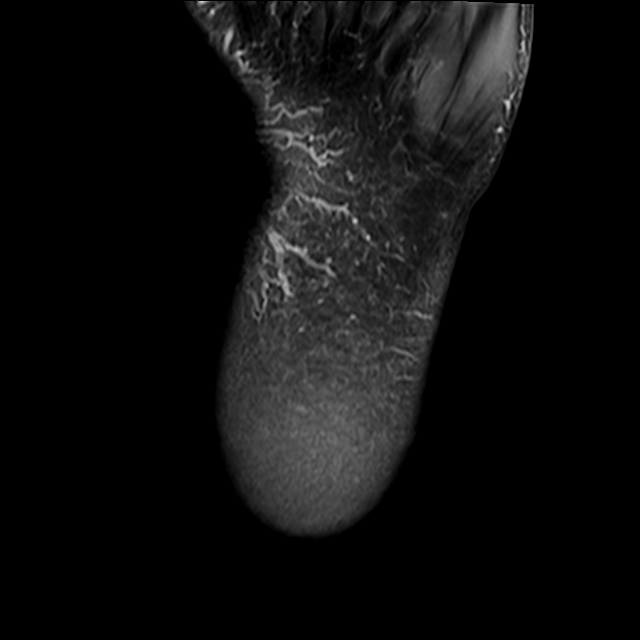
[im 4/36]
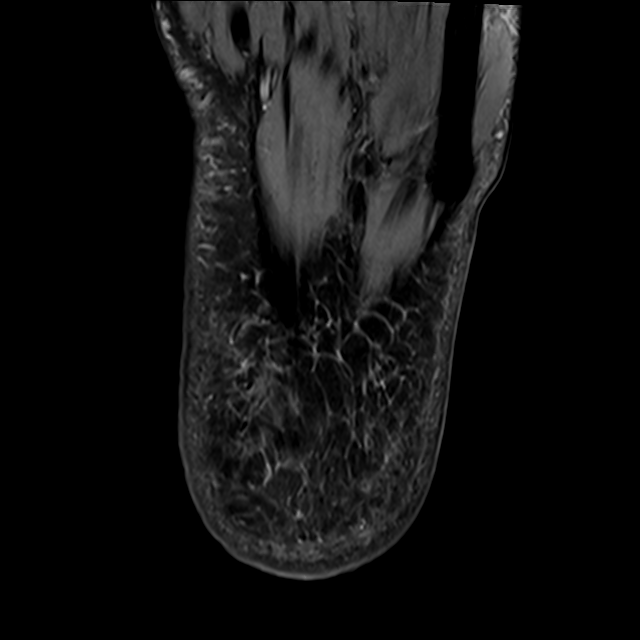
[im 20/36]
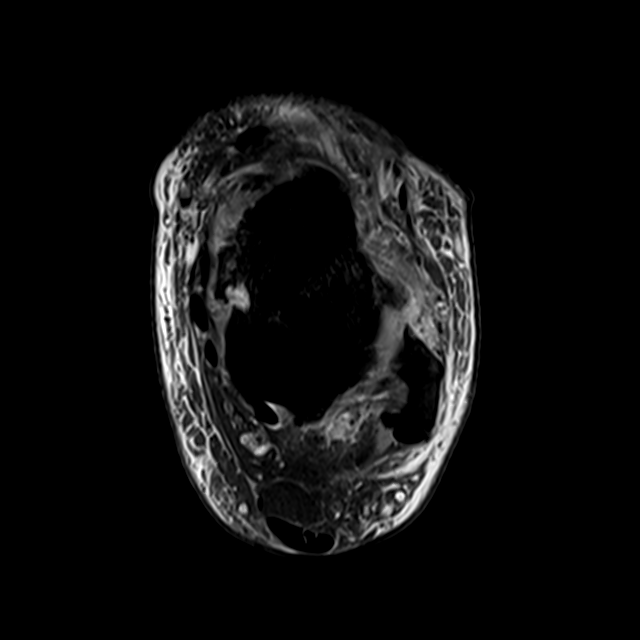
[im 32/36]
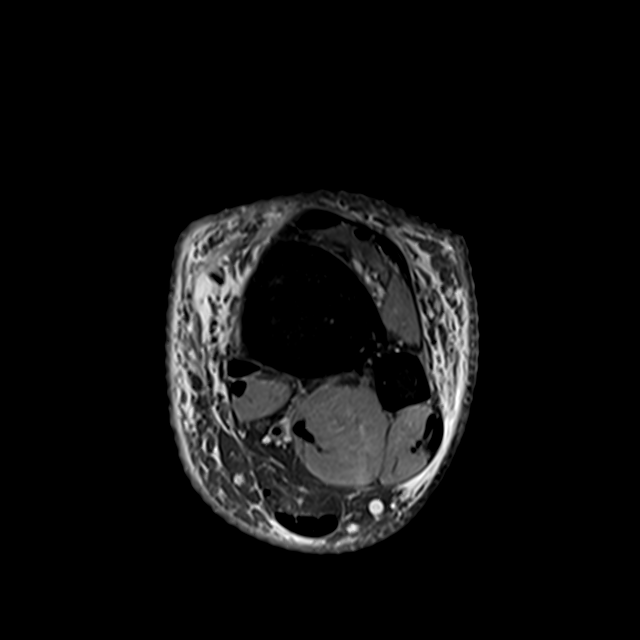

[Series 5: T2 fat-sat · axial · left · 3.0mm · 0.25mm/px · z∈[-56,+48]mm · 3 of 36 slices shown (1 of 2)]
[im 5/36]
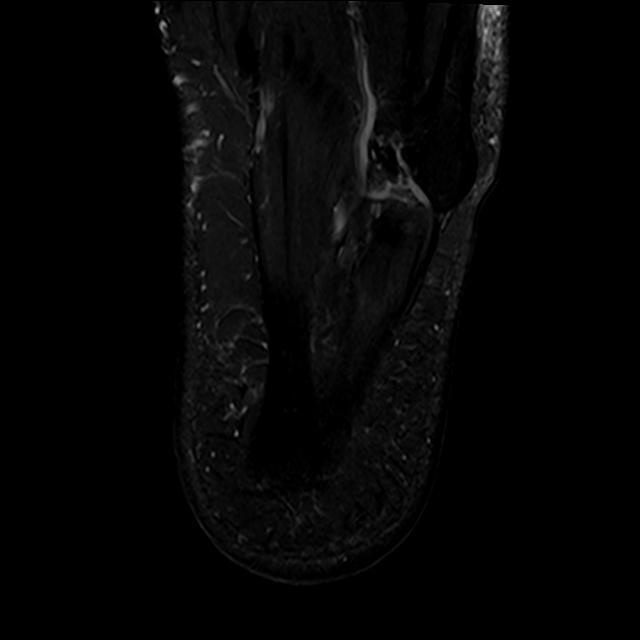
[im 18/36]
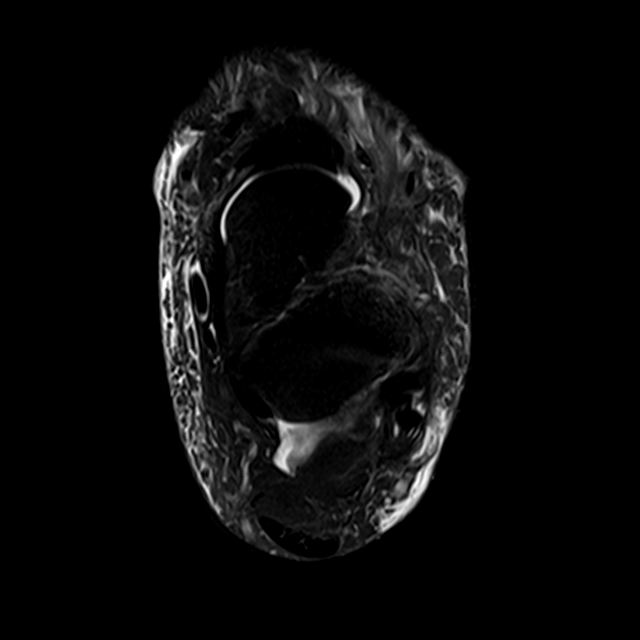
[im 31/36]
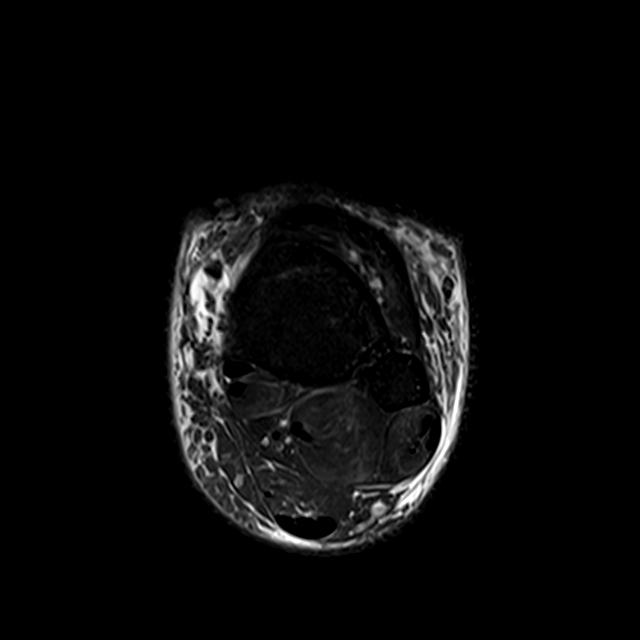

[Series 6: T1 · sagittal · left · 4.0mm · 0.27mm/px · 3 of 24 slices shown]
[im 5/24]
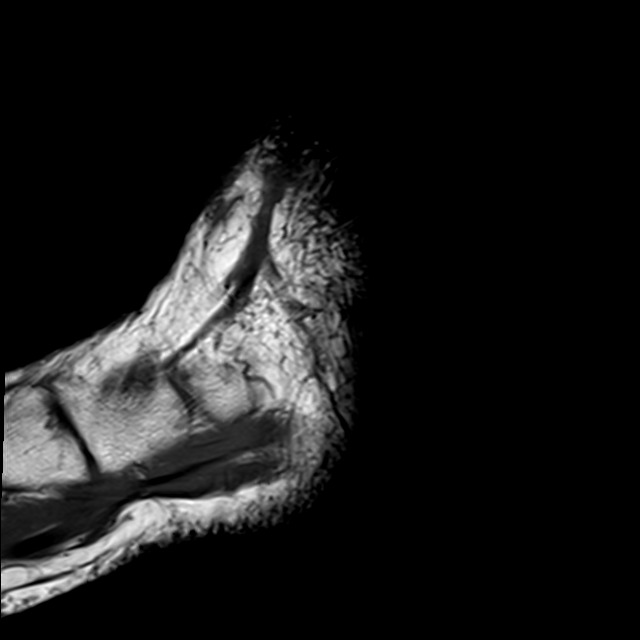
[im 14/24]
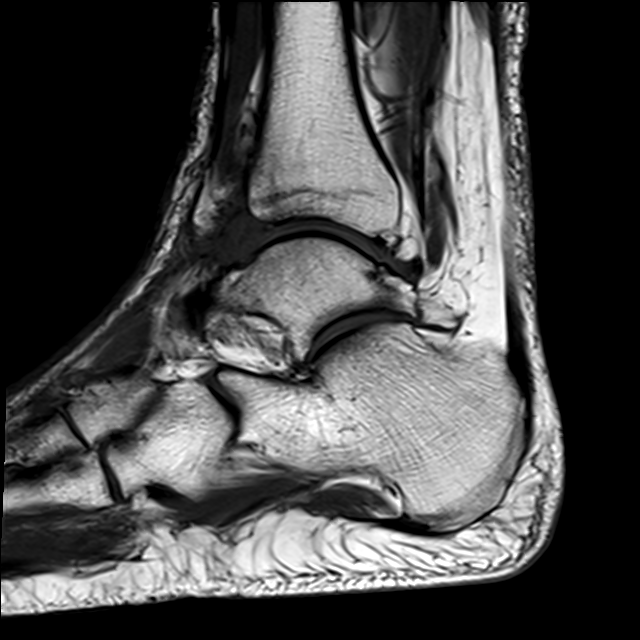
[im 24/24]
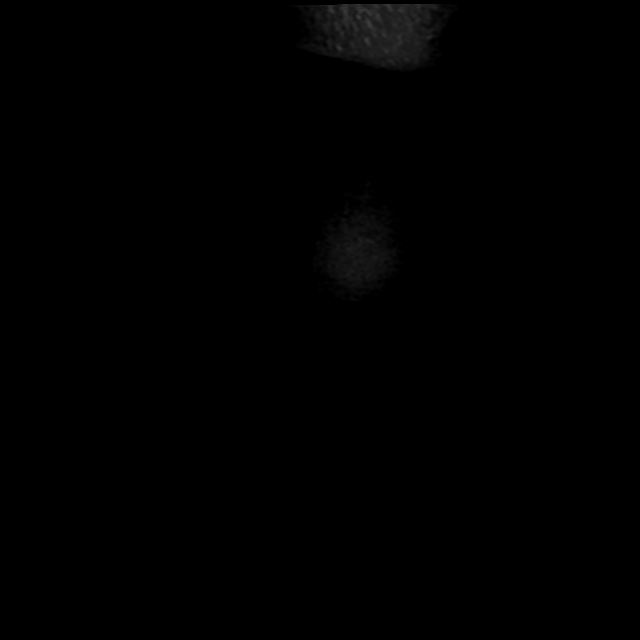

[Series 9: T2 fat-sat · coronal · left · 3.0mm · 0.25mm/px · 3 of 33 slices shown (2 of 2)]
[im 5/33]
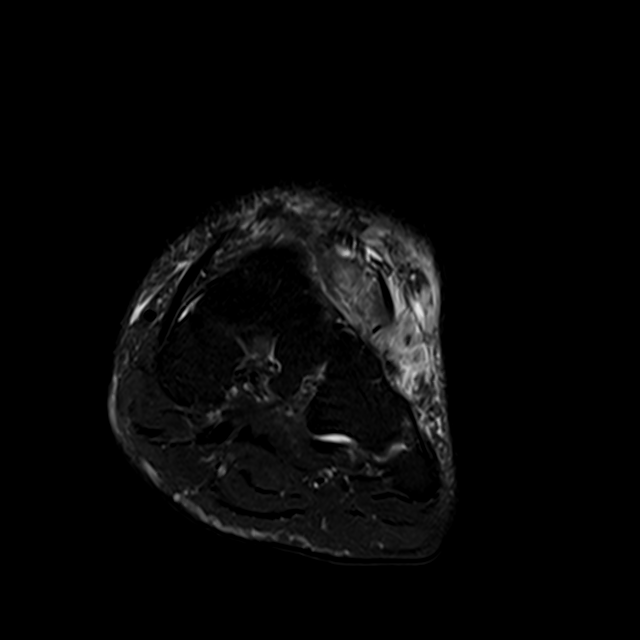
[im 17/33]
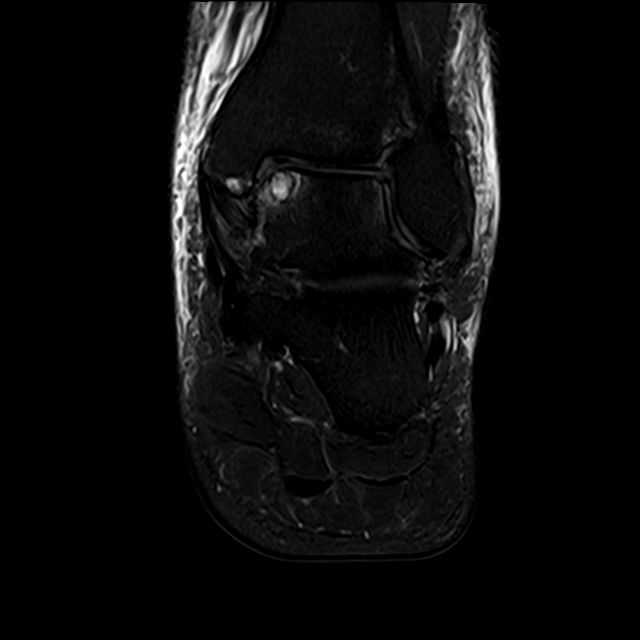
[im 29/33]
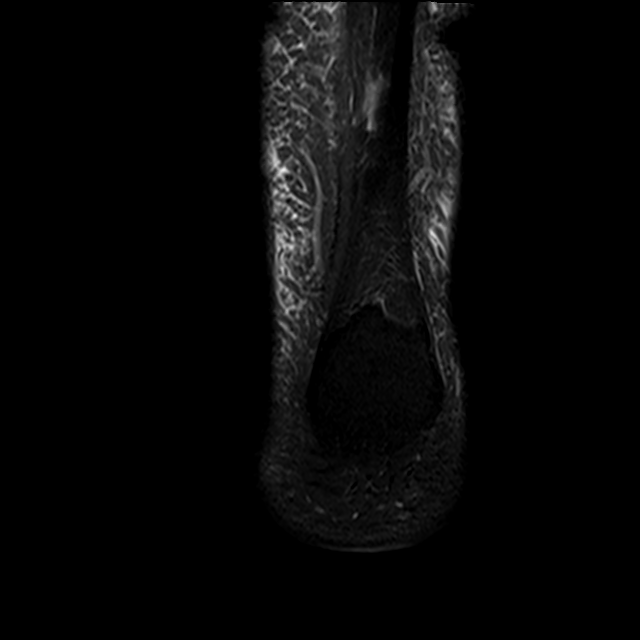

[13 of 40 positions shown; findings below may reference images not displayed]

FINDINGS: TENDONS

Peroneal: Normal.

Posteromedial: Normal.

Anterior: Normal.

Achilles: Normal.

Plantar Fascia: Normal.

LIGAMENTS

Lateral: The posterior talofibular ligament is intact. The anterior
talofibular ligament and calcaneal fibular ligament are not
identified and may be chronically torn.

Medial: Normal.

CARTILAGE

Ankle Joint: There multiple subcortical cysts in the medial and
lateral aspects of the dome of the talus with small subcortical
cysts in the medial malleolus and subcortical edema in the anterior
aspect of the distal tibia. No joint effusion. However, there is
abnormal soft tissue at the anterior aspect of the ankle joint,
probably representing arthrofibrosis. There is only slight diffuse
thinning of the articular cartilage in the ankle joint.

Subtalar Joints/Sinus Tarsi: Normal.

Bones: Arthritic changes of the ankle joint as described above.
Minimal dorsal spurring at the talonavicular joint.

Other: Nonspecific subcutaneous edema circumferentially at the level
of the ankle and extending onto the dorsal lateral aspect of the
foot.
IMPRESSION: 1. Arthritic changes of the ankle joint as described.
2. Abnormal soft tissue at the ankle joint primarily anteriorly,
consistent with arthrofibrosis.
3. Anterior talofibular ligament and calcaneofibular ligament are
not identified and may be chronically torn.

## 2020-08-22 ENCOUNTER — Ambulatory Visit: Payer: 59 | Admitting: Allergy & Immunology

## 2020-08-31 IMAGING — CR DG CHEST 1V PORT
1 series · 1 of 1 positions shown · non-contrast
Comparison: None.

CLINICAL DATA: Per Triage Note: Patient c/o fever with body aches,
nausea, vomiting, and diarrhea x1 week. Per patient fatigue and loss
of appetite. Decrease in smell and taste. Patient works in retail
and is unsure of any contact with Covid.

EXAM:
PORTABLE CHEST 1 VIEW

[portable]
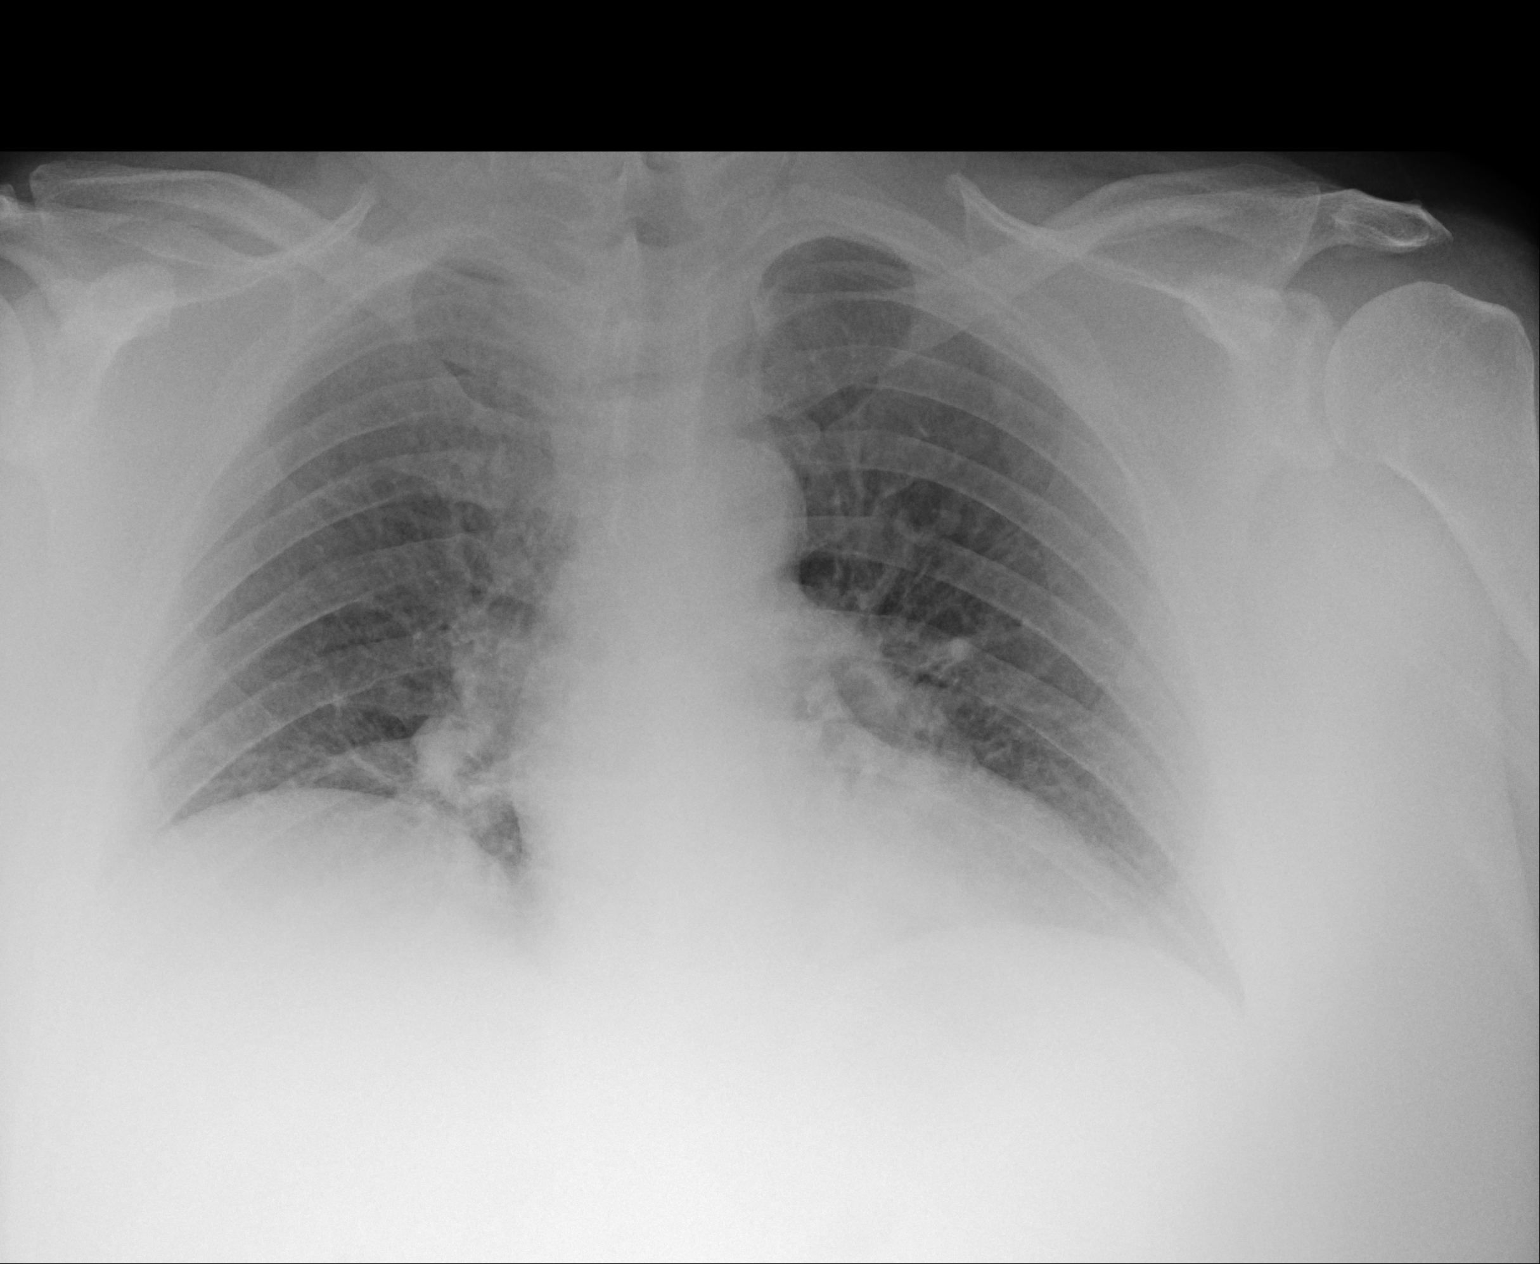

[1 of 1 positions shown; findings below may reference images not displayed]

FINDINGS: Shallow lung inflation. Heart size is normal. There are no focal
consolidations or pleural effusions. No pulmonary edema.
IMPRESSION: No evidence for acute cardiopulmonary abnormality.

## 2024-01-19 ENCOUNTER — Encounter (HOSPITAL_BASED_OUTPATIENT_CLINIC_OR_DEPARTMENT_OTHER): Payer: Self-pay | Admitting: Orthopaedic Surgery

## 2024-01-19 ENCOUNTER — Other Ambulatory Visit: Payer: Self-pay

## 2024-01-19 ENCOUNTER — Encounter (HOSPITAL_BASED_OUTPATIENT_CLINIC_OR_DEPARTMENT_OTHER)
Admission: RE | Admit: 2024-01-19 | Discharge: 2024-01-19 | Disposition: A | Discharge status: Home / Self Care | Source: Ambulatory Visit | Attending: Orthopaedic Surgery | Admitting: Orthopaedic Surgery

## 2024-01-19 DIAGNOSIS — Z01818 Encounter for other preprocedural examination: Secondary | ICD-10-CM | POA: Diagnosis present

## 2024-01-19 LAB — BASIC METABOLIC PANEL WITH GFR
Anion gap: 13 (ref 5–15)
BUN: 18 mg/dL (ref 6–20)
CO2: 24 mmol/L (ref 22–32)
Calcium: 9.3 mg/dL (ref 8.9–10.3)
Chloride: 99 mmol/L (ref 98–111)
Creatinine, Ser: 1.23 mg/dL (ref 0.61–1.24)
GFR, Estimated: >60 mL/min (ref >60)
Glucose, Bld: 205 mg/dL — ABNORMAL HIGH (ref 70–99)
Potassium: 3.2 mmol/L — ABNORMAL LOW (ref 3.5–5.1)
Sodium: 136 mmol/L (ref 135–145)

## 2024-01-21 ENCOUNTER — Encounter (HOSPITAL_BASED_OUTPATIENT_CLINIC_OR_DEPARTMENT_OTHER): Admission: RE | Payer: Self-pay | Source: Home / Self Care

## 2024-01-21 ENCOUNTER — Ambulatory Visit (HOSPITAL_BASED_OUTPATIENT_CLINIC_OR_DEPARTMENT_OTHER): Admission: RE | Admit: 2024-01-21 | Source: Home / Self Care | Admitting: Orthopaedic Surgery

## 2024-01-21 DIAGNOSIS — I10 Essential (primary) hypertension: Secondary | ICD-10-CM

## 2024-01-21 HISTORY — DX: Strain of muscle, fascia and tendon of other parts of biceps, left arm, initial encounter: S46.212A

## 2024-01-21 HISTORY — DX: Unspecified osteoarthritis, unspecified site: M19.90

## 2024-01-21 HISTORY — DX: Sleep apnea, unspecified: G47.30

## 2024-01-21 SURGERY — REPAIR, TENDON, BICEPS, DISTAL
Anesthesia: Choice | Laterality: Left
